# Patient Record
Sex: Female | Born: 1935 | Race: White | Hispanic: No | Smoking: Never smoker
Health system: Southern US, Community
[De-identification: ages and names within clinical notes are randomized; demographics above are authoritative.]

## PROBLEM LIST (undated history)

## (undated) DIAGNOSIS — E785 Hyperlipidemia, unspecified: Secondary | ICD-10-CM

## (undated) DIAGNOSIS — M549 Dorsalgia, unspecified: Secondary | ICD-10-CM

## (undated) DIAGNOSIS — I8390 Asymptomatic varicose veins of unspecified lower extremity: Secondary | ICD-10-CM

## (undated) HISTORY — DX: Asymptomatic varicose veins of unspecified lower extremity: I83.90

## (undated) HISTORY — DX: Dorsalgia, unspecified: M54.9

## (undated) HISTORY — DX: Hyperlipidemia, unspecified: E78.5

---

## 2011-01-20 DIAGNOSIS — L309 Dermatitis, unspecified: Secondary | ICD-10-CM | POA: Insufficient documentation

## 2011-01-20 DIAGNOSIS — Z8673 Personal history of transient ischemic attack (TIA), and cerebral infarction without residual deficits: Secondary | ICD-10-CM | POA: Insufficient documentation

## 2011-01-20 DIAGNOSIS — I8390 Asymptomatic varicose veins of unspecified lower extremity: Secondary | ICD-10-CM | POA: Insufficient documentation

## 2011-11-25 DIAGNOSIS — L719 Rosacea, unspecified: Secondary | ICD-10-CM | POA: Insufficient documentation

## 2013-05-17 DIAGNOSIS — Z90710 Acquired absence of both cervix and uterus: Secondary | ICD-10-CM | POA: Insufficient documentation

## 2013-05-17 DIAGNOSIS — N816 Rectocele: Secondary | ICD-10-CM | POA: Insufficient documentation

## 2015-12-03 ENCOUNTER — Ambulatory Visit (INDEPENDENT_AMBULATORY_CARE_PROVIDER_SITE_OTHER): Payer: Medicare HMO | Admitting: Podiatry

## 2015-12-03 ENCOUNTER — Encounter: Payer: Self-pay | Admitting: Podiatry

## 2015-12-03 ENCOUNTER — Ambulatory Visit (INDEPENDENT_AMBULATORY_CARE_PROVIDER_SITE_OTHER): Payer: Medicare HMO

## 2015-12-03 VITALS — BP 129/67 | HR 71 | Resp 16 | Ht 64.0 in | Wt 175.0 lb

## 2015-12-03 DIAGNOSIS — M79676 Pain in unspecified toe(s): Secondary | ICD-10-CM | POA: Diagnosis not present

## 2015-12-03 DIAGNOSIS — B351 Tinea unguium: Secondary | ICD-10-CM | POA: Diagnosis not present

## 2015-12-03 DIAGNOSIS — M2010 Hallux valgus (acquired), unspecified foot: Secondary | ICD-10-CM

## 2015-12-03 DIAGNOSIS — M2040 Other hammer toe(s) (acquired), unspecified foot: Secondary | ICD-10-CM

## 2015-12-03 DIAGNOSIS — Q828 Other specified congenital malformations of skin: Secondary | ICD-10-CM

## 2015-12-03 NOTE — Progress Notes (Signed)
   Subjective:    Patient ID: Beth Cantu, female    DOB: 03/27/1925, 82 y.o.   MRN: [id number]  HPI: She presents today with chief complaint of painful hammertoes and bunion deformities bilateral. She is also complaining of thick dystrophic painful nails as well as corns and calluses particularly on the tips of the toes. She states that this is one of several months years just seems to be getting worse.    Review of Systems  Musculoskeletal: Positive for arthralgias.  All other systems reviewed and are negative.      Objective:   Physical Exam: Vital signs are stable she is alert and oriented 3 pulses are palpable. Neurologic sensorium is intact. Deep tendon reflexes are intact. Muscle strength is 5 over 5 dorsiflexion plantar flexors and inverters everters on his musculature is intact. Orthopedic evaluation demonstrates all joints distal to the ankle in full range of motion without crepitus. HAV deformity rigid hammertoe deformity noted resulting in distal clavi multiple porokeratotic lesions plantar aspect of bilateral foot. Hallux abductovalgus deformity is present with thick yellow dystrophic onychomycotic nails to the hallux nails and the lesser nails alike.        Assessment & Plan:  Hallux abductovalgus deformity hammertoe deformities. Porokeratosis bilateral. Pain in limb secondary to onychomycosis.  Plan: Debridement toenails 1 through 5 bilateral. Debridement of porokeratotic lesions bilateral. Follow up with her in 3 months.

## 2015-12-20 ENCOUNTER — Ambulatory Visit: Payer: Self-pay | Admitting: Family Medicine

## 2015-12-20 ENCOUNTER — Ambulatory Visit (INDEPENDENT_AMBULATORY_CARE_PROVIDER_SITE_OTHER): Payer: Medicare HMO | Admitting: Family Medicine

## 2015-12-20 ENCOUNTER — Encounter: Payer: Self-pay | Admitting: Family Medicine

## 2015-12-20 VITALS — BP 142/80 | HR 88 | Resp 12 | Ht 64.0 in | Wt 183.5 lb

## 2015-12-20 DIAGNOSIS — R351 Nocturia: Secondary | ICD-10-CM

## 2015-12-20 DIAGNOSIS — M25579 Pain in unspecified ankle and joints of unspecified foot: Secondary | ICD-10-CM

## 2015-12-20 DIAGNOSIS — I83893 Varicose veins of bilateral lower extremities with other complications: Secondary | ICD-10-CM

## 2015-12-20 DIAGNOSIS — G459 Transient cerebral ischemic attack, unspecified: Secondary | ICD-10-CM | POA: Diagnosis not present

## 2015-12-20 DIAGNOSIS — I83892 Varicose veins of left lower extremities with other complications: Secondary | ICD-10-CM | POA: Insufficient documentation

## 2015-12-20 MED ORDER — CLOPIDOGREL BISULFATE 75 MG PO TABS: 75.0000 mg | ORAL_TABLET | Freq: Every day | ORAL | Status: DC

## 2015-12-20 NOTE — Progress Notes (Signed)
HPI:   [name] is a 82 y.o. female, who is here today to establish care with me.  Former PCP: Dr Beth Cantu in  [place], [place]    Last preventive routine visit: 01/2015.    She lives alone, daughter calls daily. . Independent ADL's and IADL's, except for occasional urinary urgency incontinence.  + falls in the past year, a few months ago, tripped. She  denies depression symptoms.  Concerns today: LE edema, L>R, chronic , Hx of vein disease, cannot put on compression stocking. She states that years ago she was evaluated by vascular surgeon, imaging done and "every thing was fine".  Edema is worse at the end of the day, no leg pain or erythema. No orthopnea or PND.      -Requesting refills for Plavix. She takes Plavix since 2003 because episode of confusion for a few minutes, attributed it to stress. She denies side effects. Denies severe/frequent headache, visual changes, chest pain, dyspnea, palpitation, claudication, or focal weakness.   -Also requesting handicap sticker, she has Hx of foot pain, which she attributes to hummer toe and bunions. Hx of  OA affecting IP toe joints and occasionally knees ("sore sometimes"). She has seen podiatist.    In general she eats healthy.  + Nocturia, 2-3 times per night, this has been going on for a year or so. No gross hematuria, dysuria, or decreased urine output. She drinks about 3 cups of coffee daily.  Denies abdominal pain, nausea, vomiting, changes in bowel habits, blood in stool or melena.     Review of Systems  Constitutional: Negative for fever, activity change, appetite change, fatigue and unexpected weight change.  HENT: Negative for mouth sores, nosebleeds and trouble swallowing.   Eyes: Negative for redness and visual disturbance.  Respiratory: Negative for cough, shortness of breath and wheezing.   Cardiovascular: Positive for leg swelling. Negative for chest pain and palpitations.  Gastrointestinal:  Negative for nausea, vomiting and abdominal pain.       Negative for changes in bowel habits.  Genitourinary: Positive for urgency. Negative for dysuria, hematuria, decreased urine volume and difficulty urinating.  Musculoskeletal: Positive for arthralgias. Negative for myalgias.  Skin: Negative for color change and rash.  Neurological: Negative for seizures, syncope, weakness, numbness and headaches.  Hematological: Does not bruise/bleed easily.  Psychiatric/Behavioral: Positive for sleep disturbance (mainly because getting up to urinate). Negative for confusion. The patient is not nervous/anxious.       Current Outpatient Prescriptions on File Prior to Visit  Medication Sig Dispense Refill  . Ergocalciferol (VITAMIN D2) 400 units TABS Take by mouth.    . Multiple Vitamins-Minerals (MULTIVITAMIN ADULTS PO) Take by mouth.    . Multiple Vitamins-Minerals (PRESERVISION AREDS 2 PO) Take by mouth.     No current facility-administered medications on file prior to visit.     No past medical history on file. Allergies  Allergen Reactions  . Aspirin     Other reaction(s): Other (See Comments) Other Reaction: GI UPSET  . Erythromycin     Other reaction(s): Other (See Comments) Makes her sick    No family history on file.  Social History   Social History  . Marital Status: Unknown    Spouse Name: N/A  . Number of Children: N/A  . Years of Education: N/A   Social History Main Topics  . Smoking status: Never Smoker   . Smokeless tobacco: None  . Alcohol Use: No  . Drug Use: None  .  Sexual Activity: Not Asked   Other Topics Concern  . None   Social History Narrative    Filed Vitals:   12/20/15 1027  BP: 142/80  Pulse: 88  Resp: 12    Body mass index is 31.48 kg/(m^2).  SpO2 Readings from Last 3 Encounters:  12/20/15 97%       Physical Exam  Nursing note and vitals reviewed. Constitutional: She is oriented to person, place, and time. She appears  well-developed. No distress.  HENT:  Head: Atraumatic.  Mouth/Throat: Oropharynx is clear and moist and mucous membranes are normal.  Eyes: Conjunctivae and EOM are normal. Pupils are equal, round, and reactive to light.  Neck: No JVD present.  Cardiovascular: Normal rate and regular rhythm.   Pulses:      Dorsalis pedis pulses are 2+ on the right side, and 2+ on the left side.  ? Soft SEM RUSB Varicose veins bilateral, L>R  Respiratory: Effort normal and breath sounds normal. No respiratory distress.  GI: Soft. She exhibits no mass. There is no tenderness.  Musculoskeletal: She exhibits edema. She exhibits no tenderness.  Lymphadenopathy:    She has no cervical adenopathy.  Neurological: She is alert and oriented to person, place, and time. She has normal strength. Coordination normal.  Slow gait, mildly unstable, no assistance  Skin: Skin is warm. No erythema.  Psychiatric: She has a normal mood and affect.  Well groomed, good eye contact.      ASSESSMENT AND PLAN:     Beth Cantu was seen today for new patient (initial visit).  Diagnoses and all orders for this visit:  Varicose veins of both legs with edema  We discussed natural hx of disease, she would like vascular evaluation to discuss treatment options, laser mainly. Referral placed. LE elevation, skin care, and dorso-flexion ROM exercises of ankle may help.   -     Ambulatory referral to Vascular Surgery  Transient cerebral ischemia, unspecified transient cerebral ischemia type  Some side effects of Plavix discussed, she would like to continue it.  -     clopidogrel (PLAVIX) 75 MG tablet; Take 1 tablet (75 mg total) by mouth daily.  Nocturia  For now recommended decreasing caffeine intake and fluids 3 hours before bedtime. Since it seems to be chronic and no dysuria or other associated symptoms, I am holding on U/A or further work-up for now.   Arthralgia of foot, unspecified laterality  Handicap sticker  given. Fall precautions discussed. I recommend considering a cane.       I will see her back for her routine physical in 3 months. We will try to obtain records from former PCP before next OV.     Beth Oleksy G. SwazilandJordan, MD  [facility]. [place] office.

## 2015-12-20 NOTE — Progress Notes (Signed)
Pre visit review using our clinic review tool, if applicable. No additional management support is needed unless otherwise documented below in the visit note. 

## 2015-12-20 NOTE — Patient Instructions (Signed)
A few things to remember from today's visit:   Varicose veins of both legs with edema - Plan: Ambulatory referral to Vascular Surgery  Transient cerebral ischemia, unspecified transient cerebral ischemia type - Plan: clopidogrel (PLAVIX) 75 MG tablet  A few tips:  -As we age balance is not as good as it was, so there is a higher risks for falls. Please remove small rugs and furniture that is "in your way" and could increase the risk of falls. Stretching exercises may help with fall prevention: Yoga and Tai Chi are some examples. Low impact exercise is better, so you are not very achy the next day.  -Sun screen and avoidance of direct sun light recommended. Caution with dehydration, if working outdoors be sure to drink enough fluids.  - Some medications are not safe as we age, increases the risk of side effects and can potentially interact with other medication you are also taken;  including some of over the counter medications. Be sure to let me know when you start a new medication even if it is a dietary/vitamin supplement.   -Healthy diet low in red meet/animal fat and sugar + regular physical activity is recommended.       Vein disease is a condition that can affect the veins in the legs. It can cause leg pain, varicose veins, swollen legs, or open sores. Varicose veins are swollen and twisted veins. Things that may help: leg exercises (ankle flexion, walking),compression stocking, OTC horse chestnut seed extract 300 mg twice daily, for itchy skin cortisone and moisturizers.  Compression stockings- [facility] in [place]  Please be sure medication list is accurate. If a new problem present, please set up appointment sooner than planned today.

## 2016-01-27 ENCOUNTER — Encounter: Payer: Self-pay | Admitting: Vascular Surgery

## 2016-01-28 ENCOUNTER — Encounter: Payer: Self-pay | Admitting: Vascular Surgery

## 2016-01-28 ENCOUNTER — Ambulatory Visit (INDEPENDENT_AMBULATORY_CARE_PROVIDER_SITE_OTHER): Payer: Medicare HMO | Admitting: Vascular Surgery

## 2016-01-28 VITALS — BP 148/81 | HR 89 | Temp 97.3°F | Resp 16 | Ht 64.0 in | Wt 184.0 lb

## 2016-01-28 DIAGNOSIS — I83892 Varicose veins of left lower extremities with other complications: Secondary | ICD-10-CM

## 2016-01-28 NOTE — Progress Notes (Signed)
Subjective:     Patient ID: Beth Cantu, female   DOB: 04-08-1925, 82 y.o.   MRN: [id number]  HPI This 82 year old female was referred by Dr. Betty SwazilandJordan for evaluation of painful varicosities and swelling in the left leg. Patient has had swelling in the left leg for several years but this has worsened. She is now developing aching throbbing and burning discomfort and bulges in her distal thigh and calf area as well as worsening of the edema. It is difficult for her to wear elastic compression stockings but she has tried them without success. She has no history of DVT thrombophlebitis stasis ulcers or bleeding. She has much less swelling in the right leg but does have a few varicosities on the right side.  Past Medical History:  Diagnosis Date  . Varicose veins     Social History  Substance Use Topics  . Smoking status: Never Smoker  . Smokeless tobacco: Never Used  . Alcohol use No    No family history on file.  Allergies  Allergen Reactions  . Aspirin     Other reaction(s): Other (See Comments) Other Reaction: GI UPSET  . Erythromycin     Other reaction(s): Other (See Comments) Makes her sick     Current Outpatient Prescriptions:  .  clopidogrel (PLAVIX) 75 MG tablet, Take 1 tablet (75 mg total) by mouth daily., Disp: 90 tablet, Rfl: 1 .  Ergocalciferol (VITAMIN D2) 400 units TABS, Take by mouth., Disp: , Rfl:  .  Multiple Vitamins-Minerals (MULTIVITAMIN ADULTS PO), Take by mouth., Disp: , Rfl:  .  Multiple Vitamins-Minerals (PRESERVISION AREDS 2 PO), Take by mouth., Disp: , Rfl:   Vitals:   01/28/16 1058  BP: (!) 148/81  Pulse: 89  Resp: 16  Temp: 97.3 F (36.3 C)  SpO2: 95%  Weight: 184 lb (83.5 kg)  Height: 5\' 4"  (1.626 m)    Body mass index is 31.58 kg/m.         Review of Systems    denies chest pain, dyspnea on exertion, PND, orthopnea, hemoptysis, claudication, lateralizing weakness. No history of coronary artery disease, diabetes mellitus, or CVA.  All systems negative and complete review of systems Objective:   Physical Exam BP (!) 148/81 (BP Location: Left Arm, Patient Position: Sitting, Cuff Size: Large)   Pulse 89   Temp 97.3 F (36.3 C)   Resp 16   Ht 5\' 4"  (1.626 m)   Wt 184 lb (83.5 kg)   SpO2 95%   BMI 31.58 kg/m     Gen.-alert and oriented x3 in no apparent distress HEENT normal for age Lungs no rhonchi or wheezing Cardiovascular regular rhythm no murmurs carotid pulses 3+ palpable no bruits audible Abdomen soft nontender no palpable masses Musculoskeletal free of  major deformities Skin clear -no rashes Neurologic normal Lower extremities 3+ femoral and dorsalis pedis pulses palpable bilaterally with no edema on the right 2+ edema on the left from mid calf distally Bulging varicosities medial distal thigh and medial calf. Early hyperpigmentation lower third left leg but no active ulcer noted.  Today I performed a bedside ultrasound sono site exam. This revealed an enlarged left great saphenous vein with gross reflux down to the midcalf level.       Assessment:     Painful varicosities left leg with chronic worsening edema and pain aggravated by gross reflux left great saphenous vein    Plan:         #1 long leg elastic compression stockings  20-30 mm gradient #2 elevate legs as much as possible #3 ibuprofen daily on a regular basis for pain #4 return in 3 months-formal venous reflux exam of left leg will be performed on return Depending on results recommendations will then be made Most likely patient will benefit from laser ablation left great saphenous vein because of pain and swelling. Return in 3 months

## 2016-02-03 ENCOUNTER — Other Ambulatory Visit: Payer: Self-pay | Admitting: Vascular Surgery

## 2016-02-03 DIAGNOSIS — I83892 Varicose veins of left lower extremities with other complications: Secondary | ICD-10-CM

## 2016-02-03 DIAGNOSIS — G459 Transient cerebral ischemic attack, unspecified: Secondary | ICD-10-CM

## 2016-02-06 ENCOUNTER — Encounter (HOSPITAL_COMMUNITY): Payer: Medicare HMO

## 2016-03-17 ENCOUNTER — Ambulatory Visit: Payer: Medicare HMO | Admitting: Podiatry

## 2016-03-19 ENCOUNTER — Encounter: Payer: Medicare HMO | Admitting: Family Medicine

## 2016-03-31 ENCOUNTER — Encounter: Payer: Self-pay | Admitting: Podiatry

## 2016-03-31 ENCOUNTER — Ambulatory Visit (INDEPENDENT_AMBULATORY_CARE_PROVIDER_SITE_OTHER): Payer: Medicare HMO | Admitting: Podiatry

## 2016-03-31 DIAGNOSIS — M79676 Pain in unspecified toe(s): Secondary | ICD-10-CM | POA: Diagnosis not present

## 2016-03-31 DIAGNOSIS — Q828 Other specified congenital malformations of skin: Secondary | ICD-10-CM

## 2016-03-31 DIAGNOSIS — B351 Tinea unguium: Secondary | ICD-10-CM

## 2016-03-31 NOTE — Progress Notes (Signed)
She presents today with chief complaint of painful elongated toenails bilaterally and painful calluses.  Objective: Vital signs are stable she is alert and oriented 3. Pulses are palpable. Neurologic sensorium is intact. Toenails are thick yellow dystrophic mycotic hammertoe deformities noted bilateral. Multiple distal clavi right foot and plantar were keratomas right foot. Edema to the left foot.  Assessment: Pain in limb secondary to onychomycosis and porokeratosis bilaterally.  Plan. Debridement of all reactive hyperkeratosis bilateral. Debridement of toenails

## 2016-04-14 ENCOUNTER — Encounter: Payer: Self-pay | Admitting: Family Medicine

## 2016-04-14 ENCOUNTER — Ambulatory Visit (INDEPENDENT_AMBULATORY_CARE_PROVIDER_SITE_OTHER): Payer: Medicare HMO | Admitting: Family Medicine

## 2016-04-14 VITALS — BP 132/80 | HR 97 | Temp 97.5°F | Resp 12 | Ht 64.0 in | Wt 183.0 lb

## 2016-04-14 DIAGNOSIS — Z0000 Encounter for general adult medical examination without abnormal findings: Secondary | ICD-10-CM

## 2016-04-14 DIAGNOSIS — L304 Erythema intertrigo: Secondary | ICD-10-CM

## 2016-04-14 DIAGNOSIS — Z23 Encounter for immunization: Secondary | ICD-10-CM | POA: Diagnosis not present

## 2016-04-14 DIAGNOSIS — E7800 Pure hypercholesterolemia, unspecified: Secondary | ICD-10-CM | POA: Diagnosis not present

## 2016-04-14 DIAGNOSIS — G459 Transient cerebral ischemic attack, unspecified: Secondary | ICD-10-CM

## 2016-04-14 LAB — BASIC METABOLIC PANEL
BUN: 19 mg/dL (ref 6–23)
CALCIUM: 9.5 mg/dL (ref 8.4–10.5)
CO2: 27 mEq/L (ref 19–32)
Chloride: 105 mEq/L (ref 96–112)
Creatinine, Ser: 0.56 mg/dL (ref 0.40–1.20)
GFR: 108.3 mL/min (ref >60.00)
GLUCOSE: 91 mg/dL (ref 70–99)
Potassium: 4.1 mEq/L (ref 3.5–5.1)
Sodium: 141 mEq/L (ref 135–145)

## 2016-04-14 LAB — LIPID PANEL
CHOLESTEROL: 198 mg/dL (ref 0–200)
HDL: 68.8 mg/dL (ref >39.00)
LDL CALC: 115 mg/dL — AB (ref 0–99)
NonHDL: 129.24
TRIGLYCERIDES: 73 mg/dL (ref 0.0–149.0)
Total CHOL/HDL Ratio: 3
VLDL: 14.6 mg/dL (ref 0.0–40.0)

## 2016-04-14 MED ORDER — NYSTATIN 100000 UNIT/GM EX CREA: 1.0000 "application " | TOPICAL_CREAM | Freq: Two times a day (BID) | CUTANEOUS | 2 refills | Status: DC

## 2016-04-14 MED ORDER — CLOPIDOGREL BISULFATE 75 MG PO TABS: 75.0000 mg | ORAL_TABLET | Freq: Every day | ORAL | 3 refills | Status: DC

## 2016-04-14 NOTE — Progress Notes (Signed)
Pre visit review using our clinic review tool, if applicable. No additional management support is needed unless otherwise documented below in the visit note. 

## 2016-04-14 NOTE — Patient Instructions (Signed)
A few things to remember from today's visit:   Routine general medical examination at a health care facility  Need for immunization against influenza - Plan: Flu vaccine HIGH DOSE PF  Pure hypercholesterolemia - Plan: Lipid panel  Transient cerebral ischemia, unspecified type - Plan: Basic metabolic panel  Intertrigo - Plan: nystatin cream (MYCOSTATIN)   A few tips:  -As we age balance is not as good as it was, so there is a higher risks for falls. Please remove small rugs and furniture that is "in your way" and could increase the risk of falls. Stretching exercises may help with fall prevention: Yoga and Tai Chi are some examples. Low impact exercise is better, so you are not very achy the next day.  -Sun screen and avoidance of direct sun light recommended. Caution with dehydration, if working outdoors be sure to drink enough fluids.  - Some medications are not safe as we age, increases the risk of side effects and can potentially interact with other medication you are also taken;  including some of over the counter medications. Be sure to let me know when you start a new medication even if it is a dietary/vitamin supplement.   -Healthy diet low in red meet/animal fat and sugar + regular physical activity is recommended.      Please be sure medication list is accurate. If a new problem present, please set up appointment sooner than planned today.

## 2016-04-14 NOTE — Progress Notes (Signed)
HPI:   Beth Cantu is a 82 y.o. female, who is here today for her routine physical.   She does not exercise regularly but she follows a healthful diet.  She lives alone, daughter calls her daily. She has a medical alert device around her neck.  Independent ADL's and IADL's, except for occasional urinary urgency incontinence, stable for years. She denies gross hematuria or dysuria.  She is active around her house, states that she could have somebody to help her with house cleaning but she is "very peculiar" and feels like she is able to keep up with house chores.  + falls in the past year, a few months ago, tripped. No falls since her last OV. She denies depression symptoms.   She does not use assistance for walking, she does not feel she needs it.   Chronic medical problems: Vein disease,chronic LE edema, currently she is  following with vascular surgeon and planning on having left lower extremity laser treatment. Intermittent mid back pain, L>R, it has been going on for years and resolved if she does not wear bra. -She also follows with podiatrists for hammer toes and bunions. + Knee arthralgias,mild soreness.  Mammogram "a good while", she denies any breast pain, masses,or nipple discharge. Colonoscopy June 2013, according to patient a small polyp was found and 10 years follow up recommended.    FHx for gynecologic or colon cancer negative.  She has some concerns today:   Rash: She has had intermittent, total of 2 episodes, of erythematous rash under right breast in the past 1-2 years. A few days ago she started with pruritic rash, it seems to be exacerbated by not drying area well after shower and with hot weather/sweating. No associated fever,chills, tingling, or burning.   -She would like to have labs done today. She is reporting mildly elevated cholesterol in the past, she is currently on non-pharmacologic treatment. History of TIA, in 2003 she had a  single episode of confusion that lasted a few minutes. Currently she is on Plavix, which she would like to continue, denies side effects.    She takes Ca++  And Vitamin D OTC 2000 units daily.  She denies hearing loss. She wears reading glasses. Recently she renewed her driving license.      Review of Systems  Constitutional: Negative for activity change, appetite change, fatigue, fever and unexpected weight change.  HENT: Negative for dental problem, mouth sores, nosebleeds and trouble swallowing.   Eyes: Negative for pain, redness and visual disturbance.  Respiratory: Negative for cough, shortness of breath and wheezing.   Cardiovascular: Positive for leg swelling (L>R and stable). Negative for chest pain and palpitations.  Gastrointestinal: Negative for abdominal pain, nausea and vomiting.       Negative for changes in bowel habits.  Endocrine: Negative for cold intolerance, heat intolerance, polydipsia, polyphagia and polyuria.  Genitourinary: Negative for decreased urine volume, dysuria and hematuria.  Musculoskeletal: Positive for back pain. Negative for gait problem and myalgias.  Skin: Positive for rash. Negative for wound.  Neurological: Negative for syncope, weakness, numbness and headaches.  Psychiatric/Behavioral: Negative for confusion and sleep disturbance. The patient is not nervous/anxious.       Current Outpatient Prescriptions on File Prior to Visit  Medication Sig Dispense Refill  . Ergocalciferol (VITAMIN D2) 400 units TABS Take by mouth.    . Multiple Vitamins-Minerals (MULTIVITAMIN ADULTS PO) Take by mouth.    . Multiple Vitamins-Minerals (PRESERVISION AREDS 2 PO) Take  by mouth.     No current facility-administered medications on file prior to visit.      Past Medical History:  Diagnosis Date  . Varicose veins     Allergies  Allergen Reactions  . Aspirin     Other reaction(s): Other (See Comments) Other Reaction: GI UPSET  . Erythromycin      Other reaction(s): Other (See Comments) Makes her sick    Family History  Problem Relation Age of Onset  . Cancer Father     bladder    Social History   Social History  . Marital status: Widowed    Spouse name: N/A  . Number of children: N/A  . Years of education: N/A   Social History Main Topics  . Smoking status: Never Smoker  . Smokeless tobacco: Never Used  . Alcohol use No  . Drug use: No  . Sexual activity: Not Currently   Other Topics Concern  . None   Social History Narrative  . None     Vitals:   04/14/16 0956  BP: 132/80  Pulse: 97  Resp: 12  Temp: 97.5 F (36.4 C)   Body mass index is 31.41 kg/m.  O2 sat at RA 99%  Wt Readings from Last 3 Encounters:  04/14/16 183 lb (83 kg)  01/28/16 184 lb (83.5 kg)  12/20/15 183 lb 8 oz (83.2 kg)    Physical Exam  Nursing note and vitals reviewed. Constitutional: She is oriented to person, place, and time. She appears well-developed. No distress.  HENT:  Head: Atraumatic.  Mouth/Throat: Oropharynx is clear and moist and mucous membranes are normal.  Eyes: Conjunctivae and EOM are normal. Pupils are equal, round, and reactive to light.  Neck: No thyroid mass and no thyromegaly present.  Cardiovascular: Normal rate and regular rhythm.   Murmur (Soft SEM RUSB) heard. Pulses:      Dorsalis pedis pulses are 2+ on the right side, and 2+ on the left side.  Varicose veins bilateral, L>R  Respiratory: Effort normal and breath sounds normal. No respiratory distress.  GI: Soft. She exhibits no mass. There is no tenderness.  Musculoskeletal: She exhibits edema (pitting LE edema, L>R.). She exhibits no tenderness.  Get up and go test > 15 seconds. She could not get on exam table. No signs of synovitis or significant limitation ROM.  Lymphadenopathy:    She has no cervical adenopathy.  Neurological: She is alert and oriented to person, place, and time. She has normal strength. Coordination normal.  Slow gait,  mildly unstable, no assistance  Skin: Skin is warm. Rash noted. Rash is macular. There is erythema.  Skin fold under left breast, erythematous macular lesion, no tender,no induration appreciated.  Psychiatric: She has a normal mood and affect. Her speech is normal. Cognition and memory are normal.  Well groomed, good eye contact.      ASSESSMENT AND PLAN:      Nemiah was seen today for annual exam.  Diagnoses and all orders for this visit:  Routine general medical examination at a health care facility   We discussed the importance of regular physical activity and healthy diet for prevention of chronic illness and/or complications. Preventive guidelines reviewed. She is not sure about having mammogram done, she tells me that she will let me know. Depression screening negative. Fall precautions, encouraged to use cane but she does not think she needs it. Cognitive function and memory grossly intact based on evaluation today and observation.  Vaccination updated. Ca++ and  vit D supplementation discussed and recommended. Next CPE in 1 year.    Pure hypercholesterolemia  Continue low fat diet. Further recommendations would be given according to lab results. She might benefit from statin med given her history of TIA.  -     Lipid panel  Transient cerebral ischemia, unspecified type  She would like to continue Plavix, some side effects discussed. Instructed about warning signs. Follow-up in one year.  -     Basic metabolic panel -     clopidogrel (PLAVIX) 75 MG tablet; Take 1 tablet (75 mg total) by mouth daily.  Intertrigo  Keep area dry. The fact that she doesn't wear bra due to back discomfort also may increase the risk of recurrence. Monitor for signs of infection.  Follow-up as needed.  -     nystatin cream (MYCOSTATIN); Apply 1 application topically 2 (two) times daily.  Need for immunization against influenza -     Flu vaccine HIGH DOSE PF      -He is  appropriate to follow annually, she agrees and will arrange appointment before if needed.     Return in about 1 year (around 04/14/2017) for routine.       Wilburn Keir G. Swaz[place]Jordan, MD  [facility]. [place] office.

## 2016-04-22 ENCOUNTER — Encounter: Payer: Self-pay | Admitting: Vascular Surgery

## 2016-05-05 ENCOUNTER — Ambulatory Visit (HOSPITAL_COMMUNITY)
Admission: RE | Admit: 2016-05-05 | Discharge: 2016-05-05 | Disposition: A | Discharge status: Home / Self Care | Payer: Medicare HMO | Source: Ambulatory Visit | Attending: Vascular Surgery | Admitting: Vascular Surgery

## 2016-05-05 ENCOUNTER — Ambulatory Visit (INDEPENDENT_AMBULATORY_CARE_PROVIDER_SITE_OTHER): Payer: Medicare HMO | Admitting: Vascular Surgery

## 2016-05-05 ENCOUNTER — Encounter: Payer: Self-pay | Admitting: Vascular Surgery

## 2016-05-05 VITALS — BP 127/80 | HR 81 | Temp 97.0°F | Resp 14 | Ht 64.0 in | Wt 184.0 lb

## 2016-05-05 DIAGNOSIS — G459 Transient cerebral ischemic attack, unspecified: Secondary | ICD-10-CM | POA: Diagnosis not present

## 2016-05-05 DIAGNOSIS — I83892 Varicose veins of left lower extremities with other complications: Secondary | ICD-10-CM | POA: Diagnosis not present

## 2016-05-05 NOTE — Progress Notes (Signed)
Subjective:     Patient ID: Beth Cantu, female   DOB: December 04, 1924, 82 y.o.   MRN: [id number]  HPI This 82 year old female returns for further discussion regarding her pain and swelling in the left leg. She has no history of DVT. She was evaluated 3 months ago and has tried long leg elastic compression stockings 20-30 millimeter gradient but they are very difficult for her to get on and actually hurt her ankles particular on the left after having the stockings in place. She has no history of ulcers or bleeding. She has much less degree of swelling in the contralateral right side. She does not take diuretics.  Past Medical History:  Diagnosis Date  . Varicose veins     Social History  Substance Use Topics  . Smoking status: Never Smoker  . Smokeless tobacco: Never Used  . Alcohol use No    Family History  Problem Relation Age of Onset  . Cancer Father     bladder    Allergies  Allergen Reactions  . Aspirin     Other reaction(s): Other (See Comments) Other Reaction: GI UPSET  . Erythromycin     Other reaction(s): Other (See Comments) Makes her sick     Current Outpatient Prescriptions:  .  clopidogrel (PLAVIX) 75 MG tablet, Take 1 tablet (75 mg total) by mouth daily., Disp: 90 tablet, Rfl: 3 .  Ergocalciferol (VITAMIN D2) 400 units TABS, Take by mouth., Disp: , Rfl:  .  Multiple Vitamins-Minerals (MULTIVITAMIN ADULTS PO), Take by mouth., Disp: , Rfl:  .  Multiple Vitamins-Minerals (PRESERVISION AREDS 2 PO), Take by mouth., Disp: , Rfl:  .  nystatin cream (MYCOSTATIN), Apply 1 application topically 2 (two) times daily. (Patient not taking: Reported on 05/05/2016), Disp: 45 g, Rfl: 2  Vitals:   05/05/16 1514  BP: 127/80  Pulse: 81  Resp: 14  Temp: 97 F (36.1 C)  SpO2: 98%  Weight: 184 lb (83.5 kg)  Height: 5\' 4"  (1.626 m)    Body mass index is 31.58 kg/m.        Review of Systems Denies chest pain, dyspnea on exertion, PND, orthopnea, hemoptysis     Objective:   Physical Exam BP 127/80 (BP Location: Left Arm, Patient Position: Sitting, Cuff Size: Normal)   Pulse 81   Temp 97 F (36.1 C)   Resp 14   Ht 5\' 4"  (1.626 m)   Wt 184 lb (83.5 kg)   SpO2 98%   BMI 31.58 kg/m   Gen. elderly female no apparent distress alert and oriented 3 Lungs no rhonchi or wheezing Left leg diffusely swollen from mid thigh to foot. No ulcerations, large varicosities, or hyperpigmentation noted. 3+ dorsalis pedis pulse palpable left foot.  I ordered a venous duplex exam the left leg which I reviewed and interpreted. There is no DVT. There is slight enlargement of the left great saphenous vein but there is no reflux noted. There is valvular incompetence in the deep vein system on the left down to the popliteal level.     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

## 2016-06-30 ENCOUNTER — Ambulatory Visit: Payer: Medicare HMO | Admitting: Podiatry

## 2016-07-21 ENCOUNTER — Encounter: Payer: Self-pay | Admitting: Podiatry

## 2016-07-21 ENCOUNTER — Encounter: Payer: Medicare HMO | Admitting: Podiatry

## 2016-07-21 NOTE — Progress Notes (Signed)
This encounter was created in error - please disregard.

## 2016-08-12 ENCOUNTER — Encounter: Payer: Self-pay | Admitting: Podiatry

## 2016-08-12 ENCOUNTER — Ambulatory Visit (INDEPENDENT_AMBULATORY_CARE_PROVIDER_SITE_OTHER): Payer: Medicare HMO | Admitting: Podiatry

## 2016-08-12 DIAGNOSIS — Q828 Other specified congenital malformations of skin: Secondary | ICD-10-CM

## 2016-08-12 DIAGNOSIS — B351 Tinea unguium: Secondary | ICD-10-CM

## 2016-08-12 DIAGNOSIS — M79676 Pain in unspecified toe(s): Secondary | ICD-10-CM | POA: Diagnosis not present

## 2016-08-12 NOTE — Progress Notes (Signed)
Patient ID: Beth Cantu, female   DOB: [birth year], 83 y.o.   MRN: [id number]   Subjective: This patient presents today complaining of painful toenails and right and left feet walking wearing shoes. She has had previous podiatric care with her last visit 03/31/2016. She also complaining of multiple hyperkeratotic lesions on her toes as well as this isolated lesion plantar right foot. Patient states that she's had the edema evaluated by the vascular surgeon she is advised to wear compression hose  Objective: Orientated 3 Peripheral edema left greater than right with pitting DP and PT pulses 2/4 bilaterally Capillary reflex immediate bilaterally Sensation to 10 g monofilament wire intact 5//5 right and 4/5 left Vibratory sensation reactive bilaterally Ankle reflex reactive bilaterally No open skin lesions bilaterally Atrophic skin bilaterally Distal keratoses second and third right and third left Nucleated plantar keratoses third MPJ right HAV bilaterally Hammertoe second right Manual motor testing dorsi flexion, plantar flexion 5/5 bilaterally  Assessment: Peripheral edema associated with stress suspect venous disease evaluated by vascular surgeon Symptomatic mycotic toenails 6-10 Keratoses 3 Porokeratosis 1  Plan: Debrided toenails 6-10 mechanically and electrically without any bleeding Debrided keratoses and porokeratosis 4 without any bleeding  Reappoint 3 months

## 2016-12-02 ENCOUNTER — Ambulatory Visit: Payer: Medicare HMO | Admitting: Podiatry

## 2016-12-15 ENCOUNTER — Ambulatory Visit (INDEPENDENT_AMBULATORY_CARE_PROVIDER_SITE_OTHER): Payer: Medicare HMO | Admitting: Podiatry

## 2016-12-15 ENCOUNTER — Encounter: Payer: Self-pay | Admitting: Podiatry

## 2016-12-15 DIAGNOSIS — B351 Tinea unguium: Secondary | ICD-10-CM | POA: Diagnosis not present

## 2016-12-15 DIAGNOSIS — Q828 Other specified congenital malformations of skin: Secondary | ICD-10-CM

## 2016-12-15 DIAGNOSIS — M79676 Pain in unspecified toe(s): Secondary | ICD-10-CM

## 2016-12-15 NOTE — Progress Notes (Signed)
Subjective: 83 y.o. returns the office today for painful, elongated, thickened toenails which she cannot trim herself. Denies any redness or drainage around the nails. She also gets painful calluses to her feet. She was seeing Dr. Leeanne Deeduchman but due to the new location of the office she [place] [facility]. Denies any acute changes since last appointment and no new complaints today. Denies any systemic complaints such as fevers, chills, nausea, vomiting.   Objective: AAO 3, NAD DP/PT pulses palpable, CRT less than 3 seconds  Nails hypertrophic, dystrophic, elongated, brittle, discolored 10. There is tenderness overlying the nails 1-5 bilaterally. There is no surrounding erythema or drainage along the nail sites. Small hyperkeratotic tissue to the right foot submet 3. No underlying ulceration, drainage, or signs of infection.  No open lesions or other pre-ulcerative lesions are identified. No other areas of tenderness bilateral lower extremities. No overlying edema, erythema, increased warmth. No pain with calf compression, swelling, warmth, erythema.  Assessment: Patient presents with symptomatic onychomycosis; hyperkeratotic lesion   Plan: -Treatment options including alternatives, risks, complications were discussed -Nails sharply debrided 10 without complication/bleeding. -Hyperkeratotic lesion sharply debrided x 1 without complications or bleeding.  -Discussed daily foot inspection. If there are any changes, to call the office immediately.  -Follow-up in 3 months or sooner if any problems are to arise. In the meantime, encouraged to call the office with any questions, concerns, changes symptoms.  Ovid CurdMatthew Wagoner, DPM

## 2017-01-19 ENCOUNTER — Other Ambulatory Visit: Payer: Self-pay | Admitting: Family Medicine

## 2017-01-19 DIAGNOSIS — G459 Transient cerebral ischemic attack, unspecified: Secondary | ICD-10-CM

## 2017-03-23 ENCOUNTER — Encounter: Payer: Self-pay | Admitting: Podiatry

## 2017-03-23 ENCOUNTER — Ambulatory Visit (INDEPENDENT_AMBULATORY_CARE_PROVIDER_SITE_OTHER): Payer: Medicare HMO | Admitting: Podiatry

## 2017-03-23 DIAGNOSIS — M79676 Pain in unspecified toe(s): Secondary | ICD-10-CM

## 2017-03-23 DIAGNOSIS — Q828 Other specified congenital malformations of skin: Secondary | ICD-10-CM | POA: Diagnosis not present

## 2017-03-23 DIAGNOSIS — B351 Tinea unguium: Secondary | ICD-10-CM

## 2017-03-23 NOTE — Progress Notes (Signed)
Subjective: 83 y.o. returns the office today for painful, elongated, thickened toenails which they cannot trim themself. Denies any redness or drainage around the nails. She also states that she has painful calluses to her feet and she points along the right fifth toe, right third toe the tip as well as on the medial side of both of her big toes. She denies he swelling or redness or drainage but they are painful with pressure in shoes.Denies any acute changes since last appointment and no new complaints today. Denies any systemic complaints such as fevers, chills, nausea, vomiting.   PCP: [place], Betty G, MD Last Seen: November 2017  Objective: AAO 3, NAD DP/PT pulses palpable, CRT less than 3 seconds Nails hypertrophic, dystrophic, elongated, brittle, discolored 10. There is tenderness overlying the nails 1-5 bilaterally. There is no surrounding erythema or drainage along the nail sites. Hyperkeratotic lesion to the dorsal lateral aspect of the right fifth toe, distal right third toe medial first MPJs bilaterally. There is no underlying ulceration drainage or signs of infection present. Hammertoes are present No open lesions or pre-ulcerative lesions are identified. No other areas of tenderness bilateral lower extremities. No overlying edema, erythema, increased warmth. No pain with calf compression, swelling, warmth, erythema.  Assessment: Patient presents with symptomatic onychomycosis; hyperkeratotic lesions  Plan: -Treatment options including alternatives, risks, complications were discussed -Nails sharply debrided 10 without complication/bleeding. -Hyperkeratotic lesions debrided 4 without any complications or bleeding. Offloading pads.  -Discussed daily foot inspection. If there are any changes, to call the office immediately.  -Follow-up in 3 months or sooner if any problems are to arise. In the meantime, encouraged to call the office with any questions, concerns, changes  symptoms.  Ovid Curd, DPM'

## 2017-04-20 ENCOUNTER — Encounter: Payer: Medicare HMO | Admitting: Family Medicine

## 2017-05-25 ENCOUNTER — Encounter: Payer: Medicare HMO | Admitting: Family Medicine

## 2017-07-09 ENCOUNTER — Encounter: Payer: Medicare HMO | Admitting: Family Medicine

## 2017-07-16 ENCOUNTER — Other Ambulatory Visit: Payer: Self-pay | Admitting: Family Medicine

## 2017-07-16 DIAGNOSIS — G459 Transient cerebral ischemic attack, unspecified: Secondary | ICD-10-CM

## 2017-07-21 ENCOUNTER — Encounter: Payer: Medicare HMO | Admitting: Family Medicine

## 2017-08-17 ENCOUNTER — Encounter: Payer: Medicare HMO | Admitting: Family Medicine

## 2017-09-12 NOTE — Progress Notes (Signed)
HPI:   Beth Cantu is a 84 y.o. female, who is here today for her routine physical.  Last CPE: 04/2016  Regular exercise 3 or more time per week: Not consistently but she is active around her house. Following a healthful diet: Yes She lives alone. Independent ADLs and IADLs, occasionally she has urine incontinence. She is still driving. No falls in the past year and denies depression symptoms.  Chronic medical problems: varicose veins LLE (has followed with Beth Cantu urgency incontinence, back pain, TIA, and knee OA.   She follows with other providers regularly:  Dermatologist annually, does not recall name. Ophthalmologist: [facility], left eye macular degeneration,receives periodic intraocular injection. Dentist 2 times per year. Podiatrist, Beth Cantu.   Immunization History  Administered Date(s) Administered  . Influenza, High Dose Seasonal PF 04/14/2016    Colonoscopy: 11/2011. She takes calcium and vitamin D supplementation. Depression screen PHQ 2/9 09/13/2017  Decreased Interest 0  Down, Depressed, Hopeless 0  PHQ - 2 Score 0   Fall Risk  09/13/2017 04/14/2016  Falls in the past year? No Yes  Number falls in past yr: - 1  Injury with Fall? - No  Risk for fall due to : - Impaired balance/gait;History of fall(s)  Follow up - Education provided   Functional Status Survey: Is the patient deaf or have difficulty hearing?: No Does the patient have difficulty seeing, even when wearing glasses/contacts?: No Does the patient have difficulty concentrating, remembering, or making decisions?: No Does the patient have difficulty walking or climbing stairs?: Yes(Needs to hold a rail.) Does the patient have difficulty dressing or bathing?: No Does the patient have difficulty doing errands alone such as visiting a doctor's office or shopping?: No  Mini-Cog - 09/13/17 1031    Normal clock drawing test?  no    How many words correct?  3       Refused vision test.  Concerns today:  She is requesting blood lab work today: Glucose,FLP, and kidney.  HLD on nonpharmacologic treatment.  Lab Results  Component Value Date   CHOL 198 04/14/2016   HDL 68.80 04/14/2016   LDLCALC 115 (H) 04/14/2016   TRIG 73.0 04/14/2016   CHOLHDL 3 04/14/2016    She needs refills on Plavix and Nystatin.  Remote history of TIA, a single episode and last a few minutes according to patient, in 2003. She takes Plavix 75 mg daily, tolerating medication well, she has no noted increased bruising or abnormal bleeding.  Intermittent pruritic skin rash under the breast,intermittently for 2-3 years. Nystatin cream has helped, problem is worse during summer.   Right upper back pain, exacerbated by "overusing" RUE, intermittent.  No history of recent injury. No associated fever, chills, or skin rash.   Review of Systems  Constitutional: Negative for appetite change, fatigue and fever.  HENT: Negative for hearing loss, mouth sores, sore throat and trouble swallowing.   Eyes: Negative for redness and visual disturbance.  Respiratory: Negative for cough, shortness of breath and wheezing.   Cardiovascular: Negative for chest pain and palpitations.  Gastrointestinal: Negative for abdominal pain, nausea and vomiting.       No changes in bowel habits.  Endocrine: Negative for cold intolerance, heat intolerance, polydipsia, polyphagia and polyuria.  Genitourinary: Negative for decreased urine volume, dysuria and hematuria.  Musculoskeletal: Positive for arthralgias and back pain. Negative for gait problem and neck pain.  Skin: Negative for color change and rash.  Neurological: Negative for syncope, weakness  and headaches.  Hematological: Does not bruise/bleed easily.  Psychiatric/Behavioral: Negative for confusion and sleep disturbance. The patient is not nervous/anxious.   All other systems reviewed and are negative.     Current Outpatient  Medications on File Prior to Visit  Medication Sig Dispense Refill  . Ergocalciferol (VITAMIN D2) 400 units TABS Take by mouth.    . Multiple Vitamins-Minerals (MULTIVITAMIN ADULTS PO) Take by mouth.    . Multiple Vitamins-Minerals (PRESERVISION AREDS 2 PO) Take by mouth.     No current facility-administered medications on file prior to visit.      Past Medical History:  Diagnosis Date  . Varicose veins     History reviewed. No pertinent surgical history.  Allergies  Allergen Reactions  . Aspirin     Other reaction(s): Other (See Comments) Other Reaction: GI UPSET  . Erythromycin     Other reaction(s): Other (See Comments) Makes her sick    Family History  Problem Relation Age of Onset  . Cancer Father        bladder    Social History   Socioeconomic History  . Marital status: Widowed    Spouse name: Not on file  . Number of children: Not on file  . Years of education: Not on file  . Highest education level: Not on file  Occupational History  . Not on file  Social Needs  . Financial resource strain: Not on file  . Food insecurity:    Worry: Not on file    Inability: Not on file  . Transportation needs:    Medical: Not on file    Non-medical: Not on file  Tobacco Use  . Smoking status: Never Smoker  . Smokeless tobacco: Never Used  Substance and Sexual Activity  . Alcohol use: No    Alcohol/week: 0.0 oz  . Drug use: No  . Sexual activity: Not Currently  Lifestyle  . Physical activity:    Days per week: Not on file    Minutes per session: Not on file  . Stress: Not on file  Relationships  . Social connections:    Talks on phone: Not on file    Gets together: Not on file    Attends religious service: Not on file    Active member of club or organization: Not on file    Attends meetings of clubs or organizations: Not on file    Relationship status: Not on file  Other Topics Concern  . Not on file  Social History Narrative  . Not on file      Vitals:   09/13/17 0958  BP: 130/70  Pulse: 89  Resp: 12  Temp: 98.1 F (36.7 C)  SpO2: 97%   Body mass index is 30.55 kg/m.   Wt Readings from Last 3 Encounters:  09/13/17 178 lb (80.7 kg)  05/05/16 184 lb (83.5 kg)  04/14/16 183 lb (83 kg)    Physical Exam  Nursing note and vitals reviewed. Constitutional: She is oriented to person, place, and time. She appears well-developed. No distress.  HENT:  Head: Normocephalic and atraumatic.  Right Ear: Hearing, tympanic membrane, external ear and ear canal normal.  Left Ear: Hearing, tympanic membrane, external ear and ear canal normal.  Mouth/Throat: Uvula is midline, oropharynx is clear and moist and mucous membranes are normal.  Eyes: Pupils are equal, round, and reactive to light. Conjunctivae are normal.  Neck: No tracheal deviation present. No thyroid mass present.  Cardiovascular: Normal rate and regular rhythm.  Murmur (soft SEM RUSB) heard. Pulses:      Dorsalis pedis pulses are 2+ on the right side, and 2+ on the left side.  Varicose veins in LE ,bilateral.  Respiratory: Effort normal and breath sounds normal. No respiratory distress.  GI: Soft. She exhibits no mass. There is no hepatomegaly. There is no tenderness.  Able to get on exam table  Musculoskeletal: She exhibits edema (Trace pitting LE edema, L>R. LLE with lymphedema. ).  Lymphadenopathy:    She has no cervical adenopathy.  Neurological: She is alert and oriented to person, place, and time. She has normal strength. No cranial nerve deficit. Coordination normal.  Reflex Scores:      Bicep reflexes are 2+ on the right side and 2+ on the left side.      Patellar reflexes are 2+ on the right side and 2+ on the left side. Mildly unstable gait with no assistance.  Skin: Skin is warm. No rash noted. No erythema.  Psychiatric: She has a normal mood and affect.  Well groomed, good eye contact.    ASSESSMENT AND PLAN:  Beth Cantu was here today  annual physical examination.   Orders Placed This Encounter  Procedures  . Lipid panel  . Basic metabolic panel    Lab Results  Component Value Date   CREATININE 0.58 09/13/2017   BUN 26 (H) 09/13/2017   NA 140 09/13/2017   K 4.3 09/13/2017   CL 102 09/13/2017   CO2 30 09/13/2017   Lab Results  Component Value Date   CHOL 174 09/13/2017   HDL 65.00 09/13/2017   LDLCALC 100 (H) 09/13/2017   TRIG 46.0 09/13/2017   CHOLHDL 3 09/13/2017    Routine general medical examination at a health care facility  Preventive guidelines reviewed. Vaccination up to date.  Ca++ and vit D supplementation to continue. Next CPE in a year.  Medicare annual wellness visit, subsequent  We discussed the importance of staying active, physically and mentally, as well as the benefits of a healthy/balance diet. Low impact exercise that involve stretching and strengthing are ideal.  We discussed preventive screening for the next 5-10 years, summery of recommendations given in AVS:  Annual influenza vaccine. Periodic eye exam and glaucoma screening Fall prevention. Clock test was abnormal, rest of cognitive exam was in normal limits.She does not think she has a problem with memory. Some cognitive tips given.  Advance directives and end of life discussed, she has a POA and living will.    Pure hypercholesterolemia  Continue nonpharmacologic treatment. Further recommendations will be given according to lab results.  -     Lipid panel -     Basic metabolic panel  H/O transient cerebral ischemia  One single episode in 2003, she tells me that she does not think this was a TIA. We discussed side effects of Plavix, explained that the risk of bleeding is greater at her age. She voices understanding, she would like to continue Plavix daily.  -     Lipid panel -     Basic metabolic panel  Intertrigo  Nystatin as needed helps with intermittent episodes. No changes in current  management. Follow-up as needed.  -     nystatin cream (MYCOSTATIN); Apply 1 application topically 2 (two) times daily.     Return in 1 year (on 09/14/2018).          Davionna Blacksher G. [place]Jordan, MD  [facility]. [place] office.

## 2017-09-13 ENCOUNTER — Encounter: Payer: Self-pay | Admitting: *Deleted

## 2017-09-13 ENCOUNTER — Encounter: Payer: Self-pay | Admitting: Family Medicine

## 2017-09-13 ENCOUNTER — Ambulatory Visit (INDEPENDENT_AMBULATORY_CARE_PROVIDER_SITE_OTHER): Payer: Medicare HMO | Admitting: Family Medicine

## 2017-09-13 VITALS — BP 130/70 | HR 89 | Temp 98.1°F | Resp 12 | Ht 64.0 in | Wt 178.0 lb

## 2017-09-13 DIAGNOSIS — G459 Transient cerebral ischemic attack, unspecified: Secondary | ICD-10-CM | POA: Diagnosis not present

## 2017-09-13 DIAGNOSIS — E7800 Pure hypercholesterolemia, unspecified: Secondary | ICD-10-CM | POA: Diagnosis not present

## 2017-09-13 DIAGNOSIS — Z8673 Personal history of transient ischemic attack (TIA), and cerebral infarction without residual deficits: Secondary | ICD-10-CM | POA: Diagnosis not present

## 2017-09-13 DIAGNOSIS — Z0000 Encounter for general adult medical examination without abnormal findings: Secondary | ICD-10-CM

## 2017-09-13 DIAGNOSIS — L304 Erythema intertrigo: Secondary | ICD-10-CM | POA: Diagnosis not present

## 2017-09-13 LAB — BASIC METABOLIC PANEL
BUN: 26 mg/dL — ABNORMAL HIGH (ref 6–23)
CHLORIDE: 102 meq/L (ref 96–112)
CO2: 30 meq/L (ref 19–32)
Calcium: 9.5 mg/dL (ref 8.4–10.5)
Creatinine, Ser: 0.58 mg/dL (ref 0.40–1.20)
GFR: 103.67 mL/min (ref >60.00)
GLUCOSE: 82 mg/dL (ref 70–99)
Potassium: 4.3 mEq/L (ref 3.5–5.1)
SODIUM: 140 meq/L (ref 135–145)

## 2017-09-13 LAB — LIPID PANEL
CHOL/HDL RATIO: 3
Cholesterol: 174 mg/dL (ref 0–200)
HDL: 65 mg/dL (ref >39.00)
LDL CALC: 100 mg/dL — AB (ref 0–99)
NonHDL: 109.38
TRIGLYCERIDES: 46 mg/dL (ref 0.0–149.0)
VLDL: 9.2 mg/dL (ref 0.0–40.0)

## 2017-09-13 MED ORDER — CLOPIDOGREL BISULFATE 75 MG PO TABS: 75.0000 mg | ORAL_TABLET | Freq: Every day | ORAL | 2 refills | Status: DC

## 2017-09-13 MED ORDER — NYSTATIN 100000 UNIT/GM EX CREA: 1.0000 "application " | TOPICAL_CREAM | Freq: Two times a day (BID) | CUTANEOUS | 4 refills | Status: DC

## 2017-09-13 NOTE — Patient Instructions (Addendum)
A few things to remember from today's visit:   Pure hypercholesterolemia  H/O transient cerebral ischemia   A few tips:  -As we age balance is not as good as it was, so there is a higher risks for falls. Please remove small rugs and furniture that is "in your way" and could increase the risk of falls. Stretching exercises may help with fall prevention: Yoga and Tai Chi are some examples. Low impact exercise is better, so you are not very achy the next day.  -Sun screen and avoidance of direct sun light recommended. Caution with dehydration, if working outdoors be sure to drink enough fluids.  - Some medications are not safe as we age, increases the risk of side effects and can potentially interact with other medication you are also taken;  including some of over the counter medications. Be sure to let me know when you start a new medication even if it is a dietary/vitamin supplement.   -Healthy diet low in red meet/animal fat and sugar + regular physical activity is recommended.     Cognitive Tips  Keep a journal/notebook with sections for the following (or use sections separately as needed):  Calendar and appointment sheet, schedule for each day, lists of reminders (such as grocery lists or "to do" list), homework assignments for therapy, important information such as family and friends names / addresses / phone numbers, medications, medical history and doctors name / phone numbers.  Avoid / remove clutter and unnecessary items from areas such as countertops / cabinets in kitchen and bathroom, closets, etc.  Organize items by purpose.  Baskets and bins help with this.  Leave notes for reminders above task to be completed.  For example: Note to turn off stove over the the stove; note to lock door beside the door, not to brush teeth then wash face by sink, note to take medication on table etc.  To help recall names of people of people or things, mentally or verbally go through the  alphabet to try to determine the 1st letter of the word as this may trigger the name or word you are looking for.  If this is too difficult and someone else knows the word, have them give you the first letter by asking, "does it start with an "A", "B", "C" etc. (or have them give you the first sound of the word or some other clue).  Review family events, occasions, names, etc.  Pictures are a good way to trigger memory.  Have others correct you if you answer something incorrectly.  Have them speak slowly with a few words to give you time to process and respond.  Don't let others automatically problem-solve. (For example: don't let them automatically lay out clothes in the correct position, but hand it to you folded so that you can figure it out for yourself.)  However, if you need help with tasks, they should give you as little as they can so that you can be successful.  If appropriate and safe, they may allow you to make mistakes so that you can figure out how to correct the error.  (For example, they may allow you to put your shoes on the wrong foot to see if you notice that is wrong).  If you struggle, they should give you a cue.  (Example: "Do your shoes feel right?"  "Do they look right?")  Screening schedule for the next 5-10 years:  Flu vaccine annually. Continue following with eye doctor.  Fall prevention  Advance directives:  Please see a lawyer and/or go to this website to help you with advanced directives and designating a health care power of attorney so that your wishes will be followed should you become too ill to make your own medical decisions.  [email or web address]       Please be sure medication list is accurate. If a new problem present, please set up appointment sooner than planned today.

## 2017-09-14 ENCOUNTER — Encounter: Payer: Self-pay | Admitting: *Deleted

## 2017-10-06 ENCOUNTER — Emergency Department (HOSPITAL_BASED_OUTPATIENT_CLINIC_OR_DEPARTMENT_OTHER): Payer: Medicare HMO

## 2017-10-06 ENCOUNTER — Emergency Department (HOSPITAL_BASED_OUTPATIENT_CLINIC_OR_DEPARTMENT_OTHER)
Admission: EM | Admit: 2017-10-06 | Discharge: 2017-10-06 | Disposition: A | Discharge status: Home / Self Care | Payer: Medicare HMO | Attending: Emergency Medicine | Admitting: Emergency Medicine

## 2017-10-06 ENCOUNTER — Encounter (HOSPITAL_BASED_OUTPATIENT_CLINIC_OR_DEPARTMENT_OTHER): Payer: Self-pay | Admitting: *Deleted

## 2017-10-06 ENCOUNTER — Other Ambulatory Visit: Payer: Self-pay

## 2017-10-06 DIAGNOSIS — Z8673 Personal history of transient ischemic attack (TIA), and cerebral infarction without residual deficits: Secondary | ICD-10-CM | POA: Insufficient documentation

## 2017-10-06 DIAGNOSIS — R2242 Localized swelling, mass and lump, left lower limb: Secondary | ICD-10-CM | POA: Diagnosis present

## 2017-10-06 DIAGNOSIS — Z7902 Long term (current) use of antithrombotics/antiplatelets: Secondary | ICD-10-CM | POA: Diagnosis not present

## 2017-10-06 DIAGNOSIS — L03116 Cellulitis of left lower limb: Secondary | ICD-10-CM

## 2017-10-06 DIAGNOSIS — Z79899 Other long term (current) drug therapy: Secondary | ICD-10-CM | POA: Insufficient documentation

## 2017-10-06 LAB — COMPREHENSIVE METABOLIC PANEL
ALT: 13 U/L — AB (ref 14–54)
AST: 17 U/L (ref 15–41)
Albumin: 3.3 g/dL — ABNORMAL LOW (ref 3.5–5.0)
Alkaline Phosphatase: 44 U/L (ref 38–126)
Anion gap: 10 (ref 5–15)
BUN: 21 mg/dL — ABNORMAL HIGH (ref 6–20)
CHLORIDE: 106 mmol/L (ref 101–111)
CO2: 23 mmol/L (ref 22–32)
CREATININE: 0.37 mg/dL — AB (ref 0.44–1.00)
Calcium: 8.6 mg/dL — ABNORMAL LOW (ref 8.9–10.3)
GFR calc non Af Amer: >60 mL/min (ref >60)
Glucose, Bld: 90 mg/dL (ref 65–99)
POTASSIUM: 3.8 mmol/L (ref 3.5–5.1)
Sodium: 139 mmol/L (ref 135–145)
Total Bilirubin: 0.9 mg/dL (ref 0.3–1.2)
Total Protein: 5.6 g/dL — ABNORMAL LOW (ref 6.5–8.1)

## 2017-10-06 LAB — CBC WITH DIFFERENTIAL/PLATELET
Basophils Absolute: 0 10*3/uL (ref 0.0–0.1)
Basophils Relative: 0 %
Eosinophils Absolute: 0.1 10*3/uL (ref 0.0–0.7)
Eosinophils Relative: 3 %
HEMATOCRIT: 37.6 % (ref 36.0–46.0)
HEMOGLOBIN: 12.7 g/dL (ref 12.0–15.0)
LYMPHS ABS: 1.3 10*3/uL (ref 0.7–4.0)
LYMPHS PCT: 24 %
MCH: 29.9 pg (ref 26.0–34.0)
MCHC: 33.8 g/dL (ref 30.0–36.0)
MCV: 88.5 fL (ref 78.0–100.0)
MONOS PCT: 17 %
Monocytes Absolute: 0.9 10*3/uL (ref 0.1–1.0)
NEUTROS ABS: 3 10*3/uL (ref 1.7–7.7)
NEUTROS PCT: 56 %
Platelets: 213 10*3/uL (ref 150–400)
RBC: 4.25 MIL/uL (ref 3.87–5.11)
RDW: 13.6 % (ref 11.5–15.5)
WBC: 5.3 10*3/uL (ref 4.0–10.5)

## 2017-10-06 MED ORDER — CEPHALEXIN 500 MG PO CAPS: 500.0000 mg | ORAL_CAPSULE | Freq: Two times a day (BID) | ORAL | 0 refills | Status: AC

## 2017-10-06 MED ORDER — CEPHALEXIN 250 MG PO CAPS
500.0000 mg | ORAL_CAPSULE | Freq: Once | ORAL | Status: AC
  Administered 2017-10-06: 500 mg via ORAL
  Filled 2017-10-06: qty 2

## 2017-10-06 NOTE — ED Triage Notes (Addendum)
Pt reports swelling to her bilateral le "for years off and on" the last few days has not been relieved by her usual elevation and wearing ted hose. Pt denies cp, sob or any other c/o. Pt reports a dime sized sore on the top of her left foot from sliding her feet into her shoes, 'my shoes are too tight from the swelling."

## 2017-10-06 NOTE — Discharge Instructions (Addendum)
If you develop worsening redness, pain in your leg, or if you notice drainage, fever, or any other new/concerning symptoms then return to the ER or see her primary care doctor.  Otherwise follow-up with your primary care doctor in the next 2 days for a wound recheck.

## 2017-10-06 NOTE — ED Provider Notes (Signed)
[facility] EMERGENCY DEPARTMENT Provider Note   CSN: [id number] Arrival date & time: 10/06/17  1818     History   Chief Complaint Chief Complaint  Patient presents with  . Leg Swelling    HPI Beth Cantu is a 84 y.o. female.     84 year old female presents with left leg swelling since yesterday.  She states she has "leaky veins" and has had problems with leg swelling in the left side multiple times in the past for years.  However she has been using her compression stockings and elevating her leg as instructed and it is not going away like typical.  She denies chest pain, shortness of breath, fevers.  She has had some redness to her lower leg with some warmth.  However she denies significant pain.  Her legs have become swollen to the point that she try to put on her shoe and developed a small abrasion to the top of her foot from where it scraped. When asked if her leg typically has redness when it swells she is not sure.  Past Medical History:  Diagnosis Date  . Varicose veins     Patient Active Problem List   Diagnosis Date Noted  . Pure hypercholesterolemia 04/14/2016  . Varicose veins of left leg with edema 12/20/2015  . TIA (transient ischemic attack) 12/20/2015  . H/O: hysterectomy 05/17/2013  . Hernia, rectovaginal 05/17/2013  . Acne erythematosa 11/25/2011  . Dermatitis, eczematoid 01/20/2011  . H/O transient cerebral ischemia 01/20/2011  . 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 No pertinent surgical history.   OB History   None      Home Medications    Prior to Admission medications   Medication Sig Start Date End Date Taking? Authorizing Provider  clopidogrel (PLAVIX) 75 MG tablet Take 1 tablet (75 mg total) by mouth daily. 09/13/17  Yes [place], Betty G, MD  Ergocalciferol (VITAMIN D2) 400 units TABS Take by mouth.   Yes [provider]  Multiple Vitamins-Minerals (MULTIVITAMIN ADULTS PO) Take by mouth. 02/25/09  Yes [provider]  cephALEXin (KEFLEX) 500 MG capsule Take 1 capsule (500 mg total) by mouth 2 (two) times daily for 5 days. 10/06/17 10/11/17  Pricilla Loveless, MD  Multiple Vitamins-Minerals (PRESERVISION AREDS 2 PO) Take by mouth.    [provider]  nystatin cream (MYCOSTATIN) Apply 1 application topically 2 (two) times daily. 09/13/17   [place], Betty G, MD    Family History Family History  Problem Relation Age of Onset  . Cancer Father        bladder    Social History Social History   Tobacco Use  . Smoking status: Never Smoker  . Smokeless tobacco: Never Used  Substance Use Topics  . Alcohol use: No    Alcohol/week: 0.0 oz  . Drug use: No     Allergies   Aspirin and Erythromycin   Review of Systems Review of Systems  Constitutional: Negative for fever.  Respiratory: Negative for shortness of breath.   Cardiovascular: Positive for leg swelling. Negative for chest pain.  Skin: Positive for color change and wound.  All other systems reviewed and are negative.    Physical Exam Updated Vital Signs BP (!) 155/60 (BP Location: Right Arm)   Pulse 73   Temp 97.7 F (36.5 C) (Oral)   Resp 20   Ht  (1.626 m)   Wt 80.7 kg (178 lb)   SpO2 97%   BMI 30.55 kg/m  Physical Exam  Constitutional: She is oriented to person, place, and time. She appears well-developed and well-nourished.  HENT:  Head: Normocephalic and atraumatic.  Right Ear: External ear normal.  Left Ear: External ear normal.  Nose: Nose normal.  Eyes: Right eye exhibits no discharge. Left eye exhibits no discharge.  Cardiovascular: Normal rate and regular rhythm.  Pulses:      Dorsalis pedis pulses are 2+ on the right side, and 2+ on the left side.  Pulmonary/Chest: Effort normal.  Abdominal: Soft.  Musculoskeletal:       Left ankle: She exhibits normal range of motion. No tenderness.       Left lower leg: She exhibits swelling. She exhibits no tenderness.       Legs:      Left foot:  There is swelling. There is normal range of motion and no tenderness.       Feet:  Neurological: She is alert and oriented to person, place, and time.  Skin: Skin is warm and dry.  Nursing note and vitals reviewed.    ED Treatments / Results  Labs (all labs ordered are listed, but only abnormal results are displayed) Labs Reviewed  COMPREHENSIVE METABOLIC PANEL - Abnormal; Notable for the following components:      Result Value   BUN 21 (*)    Creatinine, Ser 0.37 (*)    Calcium 8.6 (*)    Total Protein 5.6 (*)    Albumin 3.3 (*)    ALT 13 (*)    All other components within normal limits  CBC WITH DIFFERENTIAL/PLATELET    EKG None  Radiology US Venous Img Lower Unilateral Left  Result Date: 10/06/2017 CLINICAL DATA:  Left lower extremity swelling. This is chronic but worse yesterday. Color changes in varicose veins. EXAM: Left LOWER EXTREMITY VENOUS DOPPLER ULTRASOUND TECHNIQUE: Gray-scale sonography with graded compression, as well as color Doppler and duplex ultrasound were performed to evaluate the lower extremity deep venous systems from the level of the common femoral vein and including the common femoral, femoral, profunda femoral, popliteal and calf veins including the posterior tibial, peroneal and gastrocnemius veins when visible. The superficial great saphenous vein was also interrogated. Spectral Doppler was utilized to evaluate flow at rest and with distal augmentation maneuvers in the common femoral, femoral and popliteal veins. COMPARISON:  None. FINDINGS: Contralateral Common Femoral Vein: Respiratory phasicity is normal and symmetric with the symptomatic side. No evidence of thrombus. Normal compressibility. Common Femoral Vein: No evidence of thrombus. Normal compressibility, respiratory phasicity and response to augmentation. Saphenofemoral Junction: No evidence of thrombus. Normal compressibility and flow on color Doppler imaging. Profunda Femoral Vein: No evidence of  thrombus. Normal compressibility and flow on color Doppler imaging. Femoral Vein: No evidence of thrombus. Normal compressibility, respiratory phasicity and response to augmentation. Popliteal Vein: No evidence of thrombus. Normal compressibility, respiratory phasicity and response to augmentation. Calf Veins: No evidence of thrombus. Normal compressibility and flow on color Doppler imaging. Superficial Great Saphenous Vein: No evidence of thrombus. Normal compressibility. Other Findings: There is a small amount of subcutaneous soft tissue edema in the ankle region. IMPRESSION: No evidence of deep venous thrombosis. Electronically Signed   By: Burman Nieves M.D.   On: 10/06/2017 22:05    Procedures Procedures (including critical care time)  Medications Ordered in ED Medications  cephALEXin (KEFLEX) capsule 500 mg (500 mg Oral Given 10/06/17 2230)     Initial Impression / Assessment and Plan / ED Course  I have reviewed the  triage vital signs and the nursing notes.  Pertinent labs & imaging results that were available during my care of the patient were reviewed by me and considered in my medical decision making (see chart for details).     Given the unilateral swelling, DVT ultrasound obtained but is negative.  Lab work is reassuring and compared to her baseline.  At this point, given there is some redness, I think covering for cellulitis is a good idea, especially with the recent abrasion to the top of her foot.  Will discharge with Keflex.  Otherwise continue using compression stockings and elevation.  Follow-up with PCP for wound check.  Discussed return precautions.  Final Clinical Impressions(s) / ED Diagnoses   Final diagnoses:  Left leg cellulitis    ED Discharge Orders        Ordered    cephALEXin (KEFLEX) 500 MG capsule  2 times daily     10/06/17 2222       Pricilla Loveless, MD 10/06/17 2330

## 2017-10-07 ENCOUNTER — Encounter: Payer: Self-pay | Admitting: Family Medicine

## 2017-10-07 LAB — HM DIABETES EYE EXAM

## 2017-10-08 ENCOUNTER — Encounter: Payer: Self-pay | Admitting: Family Medicine

## 2017-10-08 ENCOUNTER — Ambulatory Visit (INDEPENDENT_AMBULATORY_CARE_PROVIDER_SITE_OTHER): Payer: Medicare HMO | Admitting: Family Medicine

## 2017-10-08 VITALS — BP 132/80 | HR 90 | Temp 97.9°F | Resp 12 | Ht 64.0 in | Wt 178.5 lb

## 2017-10-08 DIAGNOSIS — I83892 Varicose veins of left lower extremities with other complications: Secondary | ICD-10-CM | POA: Diagnosis not present

## 2017-10-08 DIAGNOSIS — L97521 Non-pressure chronic ulcer of other part of left foot limited to breakdown of skin: Secondary | ICD-10-CM

## 2017-10-08 DIAGNOSIS — L03116 Cellulitis of left lower limb: Secondary | ICD-10-CM

## 2017-10-08 NOTE — Patient Instructions (Addendum)
A few things to remember from today's visit:   Skin ulcer of left foot, limited to breakdown of skin (HCC)  Cellulitis of left lower extremity  Continue current management, keep wound uncovered,and elevate leg a few time per day.   Please be sure medication list is accurate. If a new problem present, please set up appointment sooner than planned today.

## 2017-10-08 NOTE — Progress Notes (Signed)
ACUTE VISIT   HPI:  Chief Complaint  Patient presents with  . Leg Swelling    Left leg swelling, started late Sunday, early Monday, went to ER on Wednesday    Beth Cantu is a 84 y.o. female, who is here today with her daughter to follow on recent ER visit. She is following earlier because she wanted to be sure everything was fine before the weekend. LLE edema that started 5 days ago,worse 2 days ago.  She was in the ER on 10/06/17 because left foot edema,ulcer, and erythema. Hx of LE varicose veins, it seems like she had a small blister on left foot and when she put on her shoe she did burst it. Intermittent LE edema, usually worse at the end of the day ,depending of activity during the day and better in the morning. Usually L>R. She has followed with vascular and vein specialist.  LE [place] neg for DVT  She was discharged on Keflex for possible cellulitis around ulcer.  She has not noted fever,chills,MS changes, or changes in appetite. Erythema and food edema have improved, she is elevating LLE a few times during the day.  She wears compression stocking.   Lab Results  Component Value Date   WBC 5.3 10/06/2017   HGB 12.7 10/06/2017   HCT 37.6 10/06/2017   MCV 88.5 10/06/2017   PLT 213 10/06/2017   Lab Results  Component Value Date   CREATININE 0.37 (L) 10/06/2017   BUN 21 (H) 10/06/2017   NA 139 10/06/2017   K 3.8 10/06/2017   CL 106 10/06/2017   CO2 23 10/06/2017    Review of Systems  Constitutional: Negative for appetite change, chills, fatigue and fever.  Respiratory: Negative for shortness of breath and wheezing.   Cardiovascular: Positive for leg swelling. Negative for chest pain and palpitations.  Gastrointestinal: Negative for abdominal pain, nausea and vomiting.  Genitourinary: Negative for decreased urine volume and dysuria.  Skin: Positive for wound. Negative for pallor.  Neurological: Negative for weakness, numbness and headaches.    Psychiatric/Behavioral: Negative for confusion and sleep disturbance.      Current Outpatient Medications on File Prior to Visit  Medication Sig Dispense Refill  . cephALEXin (KEFLEX) 500 MG capsule Take 1 capsule (500 mg total) by mouth 2 (two) times daily for 5 days. 10 capsule 0  . clopidogrel (PLAVIX) 75 MG tablet Take 1 tablet (75 mg total) by mouth daily. 90 tablet 2  . Ergocalciferol (VITAMIN D2) 400 units TABS Take by mouth.    . Multiple Vitamins-Minerals (MULTIVITAMIN ADULTS PO) Take by mouth.    . Multiple Vitamins-Minerals (PRESERVISION AREDS 2 PO) Take by mouth.    . nystatin cream (MYCOSTATIN) Apply 1 application topically 2 (two) times daily. 45 g 4   No current facility-administered medications on file prior to visit.      Past Medical History:  Diagnosis Date  . Varicose veins    Allergies  Allergen Reactions  . Aspirin     Other reaction(s): Other (See Comments) Other Reaction: GI UPSET  . Erythromycin     Other reaction(s): Other (See Comments) Makes her sick    Social History   Socioeconomic History  . Marital status: Widowed    Spouse name: Not on file  . Number of children: Not on file  . Years of education: Not on file  . Highest education level: Not on file  Occupational History  . Not on file  Social Needs  .  Financial resource strain: Not on file  . Food insecurity:    Worry: Not on file    Inability: Not on file  . Transportation needs:    Medical: Not on file    Non-medical: Not on file  Tobacco Use  . Smoking status: Never Smoker  . Smokeless tobacco: Never Used  Substance and Sexual Activity  . Alcohol use: No    Alcohol/week: 0.0 oz  . Drug use: No  . Sexual activity: Not Currently  Lifestyle  . Physical activity:    Days per week: Not on file    Minutes per session: Not on file  . Stress: Not on file  Relationships  . Social connections:    Talks on phone: Not on file    Gets together: Not on file    Attends  religious service: Not on file    Active member of club or organization: Not on file    Attends meetings of clubs or organizations: Not on file    Relationship status: Not on file  Other Topics Concern  . Not on file  Social History Narrative  . Not on file    Vitals:   10/08/17 0950  BP: 132/80  Pulse: 90  Resp: 12  Temp: 97.9 F (36.6 C)  SpO2: 94%   Body mass index is 30.64 kg/m.  Physical Exam  Nursing note and vitals reviewed. Constitutional: She is oriented to person, place, and time. She appears well-developed. No distress.  HENT:  Head: Normocephalic and atraumatic.  Mouth/Throat: Oropharynx is clear and moist and mucous membranes are normal.  Eyes: Conjunctivae are normal.  Cardiovascular: Normal rate and regular rhythm.  Pulses:      Dorsalis pedis pulses are 2+ on the right side, and 2+ on the left side.  Varicose veins LE,bilateral.  Respiratory: Effort normal and breath sounds normal. No respiratory distress.  Musculoskeletal: She exhibits edema (Left pedal edema, pitting. LLE treace pitting edema.). She exhibits no tenderness.       Feet:  Neurological: She is alert and oriented to person, place, and time.  Skin: Skin is warm. There is erythema.  Left foot dorsum,medial aspect a 2 cm X 1.2 cm superficial ulcer with granulation tissue. Mild surrounding erythema.  Psychiatric: She has a normal mood and affect. Her speech is normal.  Well groomed, good eye contact.    ASSESSMENT AND PLAN:   Beth Cantu was seen today for leg swelling.  Diagnoses and all orders for this visit:  Skin ulcer of left foot, limited to breakdown of skin (HCC)  She had a bandage covering lesion, recommend to keep area uncovered during the day and to use non sticky bandage. Keep lesion clean with soap and water. F/U in 7 days.  Cellulitis of left lower extremity  Erythema on dorsum of left foot improving. Complete abx treatment. Instructed about warning signs.  Varicose  veins of left leg with edema  She can start wearing compression stocking when ulcer resolves. LE elevation a few times in the afternoon. Good skin care.    Return in about 1 week (around 10/15/2017) for wound check..    Virgie Kunda G. [place], MD  [facility]. [facility].

## 2017-10-12 ENCOUNTER — Other Ambulatory Visit: Payer: Self-pay | Admitting: Family Medicine

## 2017-10-12 DIAGNOSIS — G459 Transient cerebral ischemic attack, unspecified: Secondary | ICD-10-CM

## 2017-10-15 ENCOUNTER — Ambulatory Visit (INDEPENDENT_AMBULATORY_CARE_PROVIDER_SITE_OTHER): Payer: Medicare HMO | Admitting: Family Medicine

## 2017-10-15 ENCOUNTER — Encounter: Payer: Self-pay | Admitting: Family Medicine

## 2017-10-15 VITALS — BP 124/76 | HR 100 | Temp 98.0°F | Resp 12 | Ht 64.0 in | Wt 176.0 lb

## 2017-10-15 DIAGNOSIS — L97529 Non-pressure chronic ulcer of other part of left foot with unspecified severity: Secondary | ICD-10-CM

## 2017-10-15 DIAGNOSIS — I83892 Varicose veins of left lower extremities with other complications: Secondary | ICD-10-CM

## 2017-10-15 NOTE — Progress Notes (Signed)
HPI:   [name] is a 84 y.o. female, who is here today to follow on recent OV.   She was seen on 10/08/17 due to LLE edema and ulcer on left foot with mild cellulitis. She completed abx treatment,Cephalexine. Tolerated medication well,no side effects.  Hx of vein disease,L>R.  She is not wearing compression stocking. Edema alleviated by LE elevation.   Edema and ulcer have improved. She is not having pain. Irritates lesion when trying to put on shoe.   No fever,chills,changes in appetite or MS.   No new concerns today.   Review of Systems  Constitutional: Negative for appetite change, chills, fatigue and fever.  Respiratory: Negative for shortness of breath and wheezing.   Cardiovascular: Positive for leg swelling. Negative for chest pain and palpitations.  Gastrointestinal: Negative for abdominal pain, nausea and vomiting.  Skin: Positive for wound.  Psychiatric/Behavioral: Negative for confusion. The patient is not nervous/anxious.       Current Outpatient Medications on File Prior to Visit  Medication Sig Dispense Refill  . clopidogrel (PLAVIX) 75 MG tablet TAKE 1 TABLET BY MOUTH EVERY DAY 90 tablet 1  . Ergocalciferol (VITAMIN D2) 400 units TABS Take by mouth.    . Multiple Vitamins-Minerals (MULTIVITAMIN ADULTS PO) Take by mouth.    . Multiple Vitamins-Minerals (PRESERVISION AREDS 2 PO) Take by mouth.    . nystatin cream (MYCOSTATIN) Apply 1 application topically 2 (two) times daily. 45 g 4   No current facility-administered medications on file prior to visit.      Past Medical History:  Diagnosis Date  . Varicose veins    Allergies  Allergen Reactions  . Aspirin     Other reaction(s): Other (See Comments) Other Reaction: GI UPSET  . Erythromycin     Other reaction(s): Other (See Comments) Makes her sick    Social History   Socioeconomic History  . Marital status: Widowed    Spouse name: Not on file  . Number of children: Not on file    . Years of education: Not on file  . Highest education level: Not on file  Occupational History  . Not on file  Social Needs  . Financial resource strain: Not on file  . Food insecurity:    Worry: Not on file    Inability: Not on file  . Transportation needs:    Medical: Not on file    Non-medical: Not on file  Tobacco Use  . Smoking status: Never Smoker  . Smokeless tobacco: Never Used  Substance and Sexual Activity  . Alcohol use: No    Alcohol/week: 0.0 oz  . Drug use: No  . Sexual activity: Not Currently  Lifestyle  . Physical activity:    Days per week: Not on file    Minutes per session: Not on file  . Stress: Not on file  Relationships  . Social connections:    Talks on phone: Not on file    Gets together: Not on file    Attends religious service: Not on file    Active member of club or organization: Not on file    Attends meetings of clubs or organizations: Not on file    Relationship status: Not on file  Other Topics Concern  . Not on file  Social History Narrative  . Not on file    Vitals:   10/15/17 1043  BP: 124/76  Pulse: 100  Resp: 12  Temp: 98 F (36.7 C)  SpO2:  96%   Body mass index is 30.21 kg/m.   Physical Exam  Nursing note and vitals reviewed. Constitutional: She is oriented to person, place, and time. She appears well-developed. No distress.  HENT:  Head: Normocephalic and atraumatic.  Eyes: Conjunctivae are normal.  Cardiovascular: Normal rate and regular rhythm.  Pulses:      Dorsalis pedis pulses are 2+ on the right side, and 2+ on the left side.  Respiratory: Effort normal and breath sounds normal. No respiratory distress.  Musculoskeletal: She exhibits edema (Trace pitting edema LLE).  Lymphadenopathy:    She has no cervical adenopathy.  Neurological: She is alert and oriented to person, place, and time. She has normal strength.  Mildly unstable gait with no assistance.  Skin: Skin is warm. No rash noted. No erythema.   Dorsum of left foot with superficial ulcer (2 cm x 1 cm) healing well,cover by dry brown crust.No erythema, no necrotic tissue of fluctuant area.   Psychiatric: She has a normal mood and affect.  Well groomed, good eye contact.    ASSESSMENT AND PLAN:  Ms. Beth Cantu was seen today for follow-up.  Diagnoses and all orders for this visit:  Ulcer of left foot, unspecified ulcer stage (HCC)  Erythema and edema around lesion has resolved. Healing well. Instructed to monitor for signs of complications. Recommend keeping area clean with soap and water. Moisturize skin around lesion with vaseline ,avoid trauma with shoe. Instructed about warning signs. F/U as needed.   Varicose veins of left leg with edema  She can start wearing compression stocking. Continue appropriate skin care.     15 min face to face OV. > 50% was dedicated to discussion of Dx, prognosis,and coordination of care.     Beth G. [place], MD  [facility]. [place] [facility].

## 2017-10-15 NOTE — Patient Instructions (Addendum)
A few things to remember from today's visit:   Ulcer of left foot, unspecified ulcer stage (HCC)  Varicose veins of left leg with edema  Keep area moisturized, monitor for redness or drainage. Elevation and compression stocking.    Please be sure medication list is accurate. If a new problem present, please set up appointment sooner than planned today.

## 2018-03-21 ENCOUNTER — Telehealth: Payer: Self-pay | Admitting: Family Medicine

## 2018-03-21 NOTE — Telephone Encounter (Signed)
Copied from CRM [id number]. Topic: General - Other >> Mar 21, 2018 11:52 AM Gerrianne Scale wrote: Reason for CRM: Daughter Jamal Collin calling stating that she has given her mother a cold and want something called into the pharmacy pt symptoms chest congestion  cough  runny nose no sore throat  her pharmacy [id number] - [place], [place] - [address] AT [facility] [address] [phone] (Phone) [phone] (Fax)  Please call daughter Okey Regal back at [phone]

## 2018-03-21 NOTE — Telephone Encounter (Signed)
OTC Rhinocort nasal spray daily. OTC antihistaminic like Zyrtec or Allegra may also help. Saline nasal irrigations several times per day. If symptoms get worse or fever develops evaluation here in the office may be necessary.  Thanks, BJ

## 2018-03-21 NOTE — Telephone Encounter (Signed)
Spoke with Okey Regal and gave directions per Dr. [place].

## 2018-03-21 NOTE — Telephone Encounter (Signed)
Spoke to [place].  Sx started 03/20/18.  She c/o of nasal congestion, cough (productive/small amount) and sneezing.  Nose is so stuffy that she cannot sleep at night.  No fever, chills or body aches.  Explained that a cold is viral so there may not be anything Dr. [place] can do.  Okey Regal would like to know what she can give her mother OTC that is safe for her age. Please advise.

## 2018-03-21 NOTE — Telephone Encounter (Signed)
Message sent to Dr. Jordan for review. Please advise 

## 2018-08-29 ENCOUNTER — Other Ambulatory Visit: Payer: Self-pay | Admitting: Family Medicine

## 2018-08-29 DIAGNOSIS — G459 Transient cerebral ischemic attack, unspecified: Secondary | ICD-10-CM

## 2018-08-29 MED ORDER — CLOPIDOGREL BISULFATE 75 MG PO TABS: 75.0000 mg | ORAL_TABLET | Freq: Every day | ORAL | 1 refills | Status: DC

## 2019-02-24 ENCOUNTER — Telehealth: Payer: Self-pay | Admitting: Family Medicine

## 2019-02-24 DIAGNOSIS — G459 Transient cerebral ischemic attack, unspecified: Secondary | ICD-10-CM

## 2019-02-24 NOTE — Telephone Encounter (Signed)
Medication: clopidogrel (PLAVIX) 75 MG tablet [   Patient is requesting a refill of this medication.    Pharmacy:  [facility] [place], [place] AT [place] [phone] (Phone) [phone] (Fax)

## 2019-02-24 NOTE — Telephone Encounter (Signed)
Requested medication (s) are due for refill today: yes  Requested medication (s) are on the active medication list: yes  Last refill:  08/29/2018  Future visit scheduled: no  Notes to clinic:  Needs labs    Requested Prescriptions  Pending Prescriptions Disp Refills   clopidogrel (PLAVIX) 75 MG tablet 90 tablet 1    Sig: Take 1 tablet (75 mg total) by mouth daily.     Hematology: Antiplatelets - clopidogrel Failed - 02/24/2019  3:42 PM      Failed - Evaluate AST, ALT within 2 months of therapy initiation.      Failed - ALT in normal range and within 360 days    ALT  Date Value Ref Range Status  10/06/2017 13 (L) 14 - 54 U/L Final         Failed - AST in normal range and within 360 days    AST  Date Value Ref Range Status  10/06/2017 17 15 - 41 U/L Final         Failed - HCT in normal range and within 180 days    HCT  Date Value Ref Range Status  10/06/2017 37.6 36.0 - 46.0 % Final         Failed - HGB in normal range and within 180 days    Hemoglobin  Date Value Ref Range Status  10/06/2017 12.7 12.0 - 15.0 g/dL Final         Failed - PLT in normal range and within 180 days    Platelets  Date Value Ref Range Status  10/06/2017 213 150 - 400 K/uL Final         Failed - Valid encounter within last 6 months    Recent Outpatient Visits          1 year ago Ulcer of left foot, unspecified ulcer stage ([place])   [place] [facility] [place], Malka So, MD   1 year ago Skin ulcer of left foot, limited to breakdown of skin ([place])   [facility] [place], Malka So, MD   1 year ago Routine general medical examination at a health care facility   [facility] [place], Malka So, MD   2 years ago Routine general medical examination at a health care facility   [employer] [facility] [place], Malka So, MD   3 years ago Varicose veins of both legs with edema   [facility] [place], Malka So, MD

## 2019-02-25 ENCOUNTER — Other Ambulatory Visit: Payer: Self-pay | Admitting: Family Medicine

## 2019-02-25 DIAGNOSIS — G459 Transient cerebral ischemic attack, unspecified: Secondary | ICD-10-CM

## 2019-02-28 ENCOUNTER — Other Ambulatory Visit: Payer: Self-pay | Admitting: Family Medicine

## 2019-02-28 ENCOUNTER — Other Ambulatory Visit: Payer: Self-pay | Admitting: *Deleted

## 2019-02-28 DIAGNOSIS — G459 Transient cerebral ischemic attack, unspecified: Secondary | ICD-10-CM

## 2019-02-28 MED ORDER — CLOPIDOGREL BISULFATE 75 MG PO TABS: 75.0000 mg | ORAL_TABLET | Freq: Every day | ORAL | 0 refills | Status: DC

## 2019-02-28 NOTE — Telephone Encounter (Signed)
Rx sent to pharmacy for 30 day supply, left detailed message for patient to call office to schedule visit for further refills.

## 2019-02-28 NOTE — Telephone Encounter (Signed)
Pt called for an update on the refill request. Pt requests call back. °

## 2019-03-01 NOTE — Telephone Encounter (Addendum)
Pt has made appt for phone visit.

## 2019-03-03 ENCOUNTER — Other Ambulatory Visit: Payer: Self-pay | Admitting: Family Medicine

## 2019-03-03 DIAGNOSIS — G459 Transient cerebral ischemic attack, unspecified: Secondary | ICD-10-CM

## 2019-03-06 ENCOUNTER — Telehealth (INDEPENDENT_AMBULATORY_CARE_PROVIDER_SITE_OTHER): Payer: Medicare HMO | Admitting: Family Medicine

## 2019-03-06 ENCOUNTER — Other Ambulatory Visit: Payer: Self-pay

## 2019-03-06 DIAGNOSIS — G459 Transient cerebral ischemic attack, unspecified: Secondary | ICD-10-CM | POA: Diagnosis not present

## 2019-03-06 DIAGNOSIS — I83892 Varicose veins of left lower extremities with other complications: Secondary | ICD-10-CM

## 2019-03-06 DIAGNOSIS — L304 Erythema intertrigo: Secondary | ICD-10-CM | POA: Diagnosis not present

## 2019-03-06 MED ORDER — NYSTATIN 100000 UNIT/GM EX CREA: 1.0000 "application " | TOPICAL_CREAM | Freq: Two times a day (BID) | CUTANEOUS | 2 refills | Status: DC

## 2019-03-06 MED ORDER — CLOPIDOGREL BISULFATE 75 MG PO TABS: 75.0000 mg | ORAL_TABLET | Freq: Every day | ORAL | 3 refills | Status: DC

## 2019-03-06 NOTE — Progress Notes (Signed)
Virtual Visit via Telephone Note  I connected with Beth Cantu on 03/06/19 at  2:30 PM EDT by telephone and verified that I am speaking with the correct person using two identifiers.   I discussed the limitations, risks, security and privacy concerns of performing an evaluation and management service by telephone and the availability of in person appointments. I also discussed with the patient that there may be a patient responsible charge related to this service. The patient expressed understanding and agreed to proceed.  Location patient: home Location provider: work office Participants present for the call: patient, provider Patient did not have a visit in the prior 7 days to address this/these issue(s).   History of Present Illness: Beth Cantu is a 85 yo female following on some chronic health problems today. She was last seen on 10/15/17.  Requesting refill for Plavix and nystatin cream.  She is trying not to leave the house if possible and does not feel comfortable coming to the clinic. Her daughter buys groceries for her. She is dealing well with social isolation. She enjoys watching TV, golden Girls and the news.   Lower extremity edema is stable. Seems to be worse at the end of the day. She has a hard time reporting compression stockings on. She has not noted erythema or ulcers.  TIA: Currently she is on Plavix 75 mg daily. She has not noted side effects.  She does not check BP regularly but recently at her ophthalmologist office BP was 128/82. Denies severe/frequent headache, visual changes, chest pain, dyspnea, palpitation,focal weakness,or syncope.  Intertrigo: Erythematosus pruritic rash under breasts. Problem seems to be exacerbated by sweating and heat. Nystatin cream has helped. She denies fever, chills, tenderness, local edema, or drainage.   Observations/Objective: Patient sounds cheerful and well on the phone. I do not appreciate any SOB. Speech and thought  processing are grossly intact. Patient reported vitals:N/A  Assessment and Plan: 1. Transient cerebral ischemia, unspecified type Side effects of Plavix discussed, she would like to continue. She is not on statin medication.  - clopidogrel (PLAVIX) 75 MG tablet; Take 1 tablet (75 mg total) by mouth daily.  Dispense: 90 tablet; Refill: 3  2. Intertrigo Try to avoid trigger factors. Nystatin cream to continue bid as needed. Keep area clean and dry. Monitor for signs of infection.  - nystatin cream (MYCOSTATIN); Apply 1 application topically 2 (two) times daily.  Dispense: 60 g; Refill: 2  3. Varicose veins of left leg with edema LE elevation and compression stocking. I am mailing information about devise that may help with putting compression stockings. Appropriate skin care discussed.   Follow Up Instructions:  Return in about 1 year (around 03/05/2020) for cpe.   I did not refer this patient for an OV in the next 24 hours for this/these issue(s).  I discussed the assessment and treatment plan with the patient. She was provided an opportunity to ask questions and all were answered. She agreed with the plan and demonstrated an understanding of the instructions.   The patient was advised to call back or seek an in-person evaluation if the symptoms worsen or if the condition fails to improve as anticipated.  I provided 21 minutes of non-face-to-face time during this encounter.   Dashun Borre Martinique, MD

## 2019-06-28 ENCOUNTER — Ambulatory Visit: Payer: Medicare Other | Attending: Internal Medicine

## 2019-06-28 DIAGNOSIS — Z23 Encounter for immunization: Secondary | ICD-10-CM

## 2019-06-28 NOTE — Progress Notes (Signed)
   Covid-19 Vaccination Clinic  Name:  Beth Cantu    MRN: [id number] DOB: [birth year]  06/28/2019  [name] was observed post Covid-19 immunization for 15 minutes without incidence. She was provided with Vaccine Information Sheet and instruction to access the V-Safe system.   [name] was instructed to call 911 with any severe reactions post vaccine: Marland Kitchen Difficulty breathing  . Swelling of your face and throat  . A fast heartbeat  . A bad rash all over your body  . Dizziness and weakness    Immunizations Administered    Name Date Dose VIS Date Route   Pfizer COVID-19 Vaccine 06/28/2019  4:32 PM 0.3 mL 05/19/2019 Intramuscular   Manufacturer: [employer], [employer]   Lot: [id number]   NDC: [address]-1

## 2019-07-16 ENCOUNTER — Ambulatory Visit: Payer: Medicare HMO | Attending: Internal Medicine

## 2019-07-16 DIAGNOSIS — Z23 Encounter for immunization: Secondary | ICD-10-CM | POA: Insufficient documentation

## 2019-07-16 NOTE — Progress Notes (Signed)
   Covid-19 Vaccination Clinic  Name:  Beth Cantu    MRN: [id number] DOB: 11-11-1924  07/16/2019  Beth Cantu was observed post Covid-19 immunization for 30 minutes based on pre-vaccination screening without incidence. She was provided with Vaccine Information Sheet and instruction to access the V-Safe system.   Beth Cantu was instructed to call 911 with any severe reactions post vaccine: Marland Kitchen Difficulty breathing  . Swelling of your face and throat  . A fast heartbeat  . A bad rash all over your body  . Dizziness and weakness    Immunizations Administered    Name Date Dose VIS Date Route   Pfizer COVID-19 Vaccine 07/16/2019 10:52 AM 0.3 mL 05/19/2019 Intramuscular   Manufacturer: [employer], [employer]   Lot: [id number]   NDC: [id number]

## 2019-08-03 ENCOUNTER — Encounter: Payer: Self-pay | Admitting: Family Medicine

## 2019-10-03 ENCOUNTER — Telehealth: Payer: Self-pay | Admitting: Family Medicine

## 2019-10-03 NOTE — Telephone Encounter (Signed)
Pt states due to her disability and having a steep inclined drive way she would like for her PCP to write a letter to the post office explaining she is unable to walk down her driveway and to deliver her mail to her door.  Pt would like it mailed to her once it is ready. Address on file has been verified.   Pt would like a call once it has been mailed [phone]

## 2019-10-03 NOTE — Telephone Encounter (Signed)
Message Routed to PCP for review and approval. 

## 2019-10-04 ENCOUNTER — Encounter: Payer: Self-pay | Admitting: Family Medicine

## 2019-10-04 NOTE — Telephone Encounter (Signed)
Letter completed and mailed to address on file. Patient notified and verbalized understanding.

## 2019-10-04 NOTE — Telephone Encounter (Signed)
Yes, it is ok to provide letter as requested. Thanks, BJ

## 2019-10-30 ENCOUNTER — Other Ambulatory Visit: Payer: Self-pay

## 2019-10-31 ENCOUNTER — Ambulatory Visit (INDEPENDENT_AMBULATORY_CARE_PROVIDER_SITE_OTHER): Payer: Medicare HMO | Admitting: Family Medicine

## 2019-10-31 ENCOUNTER — Encounter: Payer: Self-pay | Admitting: Family Medicine

## 2019-10-31 VITALS — BP 132/80 | HR 89 | Temp 97.7°F | Resp 16 | Ht 64.0 in | Wt 177.2 lb

## 2019-10-31 DIAGNOSIS — Z0000 Encounter for general adult medical examination without abnormal findings: Secondary | ICD-10-CM

## 2019-10-31 DIAGNOSIS — G459 Transient cerebral ischemic attack, unspecified: Secondary | ICD-10-CM | POA: Diagnosis not present

## 2019-10-31 DIAGNOSIS — I872 Venous insufficiency (chronic) (peripheral): Secondary | ICD-10-CM

## 2019-10-31 DIAGNOSIS — E7800 Pure hypercholesterolemia, unspecified: Secondary | ICD-10-CM | POA: Diagnosis not present

## 2019-10-31 DIAGNOSIS — L304 Erythema intertrigo: Secondary | ICD-10-CM | POA: Diagnosis not present

## 2019-10-31 DIAGNOSIS — N3281 Overactive bladder: Secondary | ICD-10-CM

## 2019-10-31 DIAGNOSIS — M159 Polyosteoarthritis, unspecified: Secondary | ICD-10-CM

## 2019-10-31 LAB — COMPREHENSIVE METABOLIC PANEL
ALT: 12 U/L (ref 0–35)
AST: 16 U/L (ref 0–37)
Albumin: 4.1 g/dL (ref 3.5–5.2)
Alkaline Phosphatase: 64 U/L (ref 39–117)
BUN: 17 mg/dL (ref 6–23)
CO2: 30 mEq/L (ref 19–32)
Calcium: 9.5 mg/dL (ref 8.4–10.5)
Chloride: 103 mEq/L (ref 96–112)
Creatinine, Ser: 0.51 mg/dL (ref 0.40–1.20)
GFR: 112.61 mL/min (ref >60.00)
Glucose, Bld: 95 mg/dL (ref 70–99)
Potassium: 4.4 mEq/L (ref 3.5–5.1)
Sodium: 140 mEq/L (ref 135–145)
Total Bilirubin: 0.9 mg/dL (ref 0.2–1.2)
Total Protein: 6.4 g/dL (ref 6.0–8.3)

## 2019-10-31 LAB — LIPID PANEL
Cholesterol: 189 mg/dL (ref 0–200)
HDL: 65.6 mg/dL (ref >39.00)
LDL Cholesterol: 109 mg/dL — ABNORMAL HIGH (ref 0–99)
NonHDL: 123.61
Total CHOL/HDL Ratio: 3
Triglycerides: 73 mg/dL (ref 0.0–149.0)
VLDL: 14.6 mg/dL (ref 0.0–40.0)

## 2019-10-31 NOTE — Progress Notes (Signed)
HPI:  BethBeth Cantu is a 86 y.o. female, who is here today with her daughter for her routine physical.  Last CPE: 09/13/17  Regular exercise 3 or more time per week: She is active around her house with chores. Following a healthy diet: Yes. She lives alone. Her daughter lives close by.  Chronic medical problems: Back pain, OA,HLD,vein disease with venous stasis dermatitis.  She takesTylenol for upper back pain and OA. Back pain also alleviated by not wearing a bra.  Immunization History  Administered Date(s) Administered  . Influenza, High Dose Seasonal PF 04/14/2016  . PFIZER SARS-COV-2 Vaccination 06/28/2019, 07/16/2019  . Pneumococcal Conjugate-13 02/05/2015  . Pneumococcal Polysaccharide-23 02/25/2009   Most ADL's and IADL's independent except transfer, needs a cane and sometimes uses a walker. She does not drive frequent, her daughter helps  Her has has concerns today. She would like to check urine, Beth Cantu denies dysuria,changes in urinary frequency,or gross hematuria. Daughter is concerned because she has had UTI's in the past. Nocturia x 3-4 not every night. Occasional urine incontinence.  Unstable gait: She has no had falls but afraid of doing so.  TIA: She is on Plavix 75 mg daily. She has been on Plavix since 2003. She has a medical alert device all the time.  + IP hands and foot pain. She follows with podiatrist.  Negative for severe/frequent headache, visual changes, chest pain, dyspnea, palpitation, claudication, focal weakness, or worsening edema.  Intertrigo: Erythematous,pruritic rash under breast. Intermittent, exacerbated by heat. Topical nystatin helps. No fever,chills,local edema or tenderness.  HLD: She is on non pharmacologic treatment.  Component     Latest Ref Rng & Units 09/13/2017  Cholesterol     0 - 200 mg/dL 174  Triglycerides     0.0 - 149.0 mg/dL 46.0  HDL Cholesterol     >39.00 mg/dL 65.00  VLDL     0.0 - 40.0 mg/dL 9.2    LDL (calc)     0 - 99 mg/dL 100 (H)  Total CHOL/HDL Ratio      3  NonHDL      109.38   LE edema/vVenous stasis dermatitis: Edema and erythema, exacerbated by prolonged walking/standing. Toe nails deformities. Edema is extending to toes, left.  Review of Systems  Constitutional: Negative for appetite change, fatigue and fever.  HENT: Negative for hearing loss, mouth sores and sore throat.   Eyes: Negative for redness and visual disturbance.  Respiratory: Negative for cough, shortness of breath and wheezing.   Cardiovascular: Positive for leg swelling. Negative for chest pain.  Gastrointestinal: Negative for abdominal pain, nausea and vomiting.       No changes in bowel habits.  Endocrine: Negative for cold intolerance, heat intolerance, polydipsia, polyphagia and polyuria.  Genitourinary: Negative for decreased urine volume, dysuria and hematuria.  Musculoskeletal: Positive for arthralgias, back pain and gait problem.  Skin: Negative for color change and wound.  Neurological: Negative for syncope, weakness and headaches.  Psychiatric/Behavioral: Negative for confusion and sleep disturbance. The patient is not nervous/anxious.   All other systems reviewed and are negative.  Current Outpatient Medications on File Prior to Visit  Medication Sig Dispense Refill  . clopidogrel (PLAVIX) 75 MG tablet Take 1 tablet (75 mg total) by mouth daily. 90 tablet 3  . Ergocalciferol (VITAMIN D2) 400 units TABS Take by mouth.    . Multiple Vitamins-Minerals (PRESERVISION AREDS 2 PO) Take by mouth.     No current facility-administered medications on file  prior to visit.   Past Medical History:  Diagnosis Date  . Varicose veins    No past surgical history on file.  Allergies  Allergen Reactions  . Aspirin     Other reaction(s): Other (See Comments) Other Reaction: GI UPSET  . Erythromycin     Other reaction(s): Other (See Comments) Makes her sick    Family History  Problem Relation  Age of Onset  . Cancer Father        bladder    Social History   Socioeconomic History  . Marital status: Widowed    Spouse name: Not on file  . Number of children: Not on file  . Years of education: Not on file  . Highest education level: Not on file  Occupational History  . Not on file  Tobacco Use  . Smoking status: Never Smoker  . Smokeless tobacco: Never Used  Substance and Sexual Activity  . Alcohol use: No    Alcohol/week: 0.0 standard drinks  . Drug use: No  . Sexual activity: Not Currently  Other Topics Concern  . Not on file  Social History Narrative  . Not on file   Social Determinants of Health   Financial Resource Strain:   . Difficulty of Paying Living Expenses:   Food Insecurity:   . Worried About Charity fundraiser in the Last Year:   . Arboriculturist in the Last Year:   Transportation Needs:   . Film/video editor (Medical):   Marland Kitchen Lack of Transportation (Non-Medical):   Physical Activity:   . Days of Exercise per Week:   . Minutes of Exercise per Session:   Stress:   . Feeling of Stress :   Social Connections:   . Frequency of Communication with Friends and Family:   . Frequency of Social Gatherings with Friends and Family:   . Attends Religious Services:   . Active Member of Clubs or Organizations:   . Attends Archivist Meetings:   Marland Kitchen Marital Status:     Vitals:   10/31/19 1018  BP: 132/80  Pulse: 89  Resp: 16  Temp: 97.7 F (36.5 C)  SpO2: 97%   Body mass index is 30.42 kg/m.  Wt Readings from Last 3 Encounters:  10/31/19 177 lb 4 oz (80.4 kg)  10/15/17 176 lb (79.8 kg)  10/08/17 178 lb 8 oz (81 kg)   Physical Exam  Nursing note and vitals reviewed. Constitutional: She is oriented to person, place, and time. She appears well-developed. No distress.  HENT:  Head: Normocephalic and atraumatic.  Right Ear: Tympanic membrane, external ear and ear canal normal.  Left Ear: Tympanic membrane, external ear and ear  canal normal.  Mouth/Throat: Uvula is midline, oropharynx is clear and moist and mucous membranes are normal.  Eyes: Pupils are equal, round, and reactive to light. Conjunctivae are normal.  Cardiovascular: Normal rate and regular rhythm.  Murmur (soft SEM RUSB) heard. Pulses:      Dorsalis pedis pulses are 2+ on the right side and 2+ on the left side.  Respiratory: Effort normal and breath sounds normal. No respiratory distress.  GI: Soft. She exhibits no mass. There is no hepatomegaly. There is no abdominal tenderness.  Musculoskeletal:        General: Edema (Trace pitting LE edema,L>R.1+ pitting pedal edema right.) present.     Comments: No signs of synovitis appreciated.  Lymphadenopathy:    She has no cervical adenopathy.  Right: No supraclavicular adenopathy present.       Left: No supraclavicular adenopathy present.  Neurological: She is alert and oriented to person, place, and time. She has normal strength. No cranial nerve deficit.  Unstable gait,assisted with a cane. Get up and go > 15 seconds.  Skin: Skin is warm. Rash noted. Rash is maculopapular. There is erythema.  Under right breast , no tenderness , drainage, or edema.  Psychiatric: She has a normal mood and affect.  Well groomed, good eye contact.   ASSESSMENT AND PLAN:  Beth. MITRA DULING was here today annual physical examination.  Orders Placed This Encounter  Procedures  . Lipid panel  . Comprehensive metabolic panel  . Ambulatory referral to [place]   Lab Results  Component Value Date   CREATININE 0.51 10/31/2019   BUN 17 10/31/2019   NA 140 10/31/2019   K 4.4 10/31/2019   CL 103 10/31/2019   CO2 30 10/31/2019   Lab Results  Component Value Date   ALT 12 10/31/2019   AST 16 10/31/2019   ALKPHOS 64 10/31/2019   BILITOT 0.9 10/31/2019   Lab Results  Component Value Date   CHOL 189 10/31/2019   HDL 65.60 10/31/2019   LDLCALC 109 (H) 10/31/2019   TRIG 73.0 10/31/2019   CHOLHDL 3  10/31/2019   Routine general medical examination at a health care facility We discussed the importance of regular physical activity and healthy diet for prevention of complications. Preventive guidelines reviewed. Vaccination up to date. Fat prevention. Ca++ and vit D supplementation recommended. Next CPE in a year.  Pure hypercholesterolemia Continue non pharmacologic treatment. Further recommendations according to lab results.  TIA (transient ischemic attack) She wants to continue Plavix, some side effects discussed. She is not on statin medication,she does not think   Venous stasis dermatitis of left lower extremity LE elevation and compression stocking recommended. Appropriate skin care. Monitor for signs of infection.  Intertrigo We discussed Dx, prognosis. Continue topical nystatin daily as needed. Keep areas dry and clean with soap and water.  -     nystatin cream (MYCOSTATIN); Apply 1 application topically 2 (two) times daily.  Generalized osteoarthritis of multiple sites Continue Tylenol 500 mg tid as needed. Fall precautions. PT will be arranged.  Overactive bladder She is not interested in urine test today and or pharmacologic treatment.  Return in 6 months (on 05/02/2020) for LE edema and venous stasis + AWV.  Pallas Wahlert G. Martinique, MD  [facility]. [address] office.   A few things to remember from today's visit:  If you need refills please call your pharmacy. Do not use My Chart to request refills or for acute issues that need immediate attention.   A few tips:  -As we age balance is not as good as it was, so there is a higher risks for falls. Please remove small rugs and furniture that is "in your way" and could increase the risk of falls. Stretching exercises may help with fall prevention: Yoga and Tai Chi are some examples. Low impact exercise is better, so you are not very achy the next day.  -Sun screen and avoidance of direct sun light  recommended. Caution with dehydration, if working outdoors be sure to drink enough fluids.  - Some medications are not safe as we age, increases the risk of side effects and can potentially interact with other medication you are also taken;  including some of over the counter medications. Be sure to let me  know when you start a new medication even if it is a dietary/vitamin supplement.   -Healthy diet low in red meet/animal fat and sugar + regular physical activity is recommended.   Stasis Dermatitis Stasis dermatitis is a long-term (chronic) skin condition that happens when veins can no longer pump blood back to the heart (poor circulation). This condition causes a red or brown scaly rash or sores (ulcers) from the pooling of blood (stasis). This condition usually affects the lower legs. It may affect one leg or both legs. Without treatment, severe stasis dermatitis can lead to other skin conditions and infections. What are the causes? This condition is caused by poor circulation. What increases the risk? You are more likely to develop this condition if:  You are not very active.  You stand for long periods of time.  You have veins that have become enlarged and twisted (varicose veins).  You have leg veins that are not strong enough to send blood back to the heart (venous insufficiency).  You have had a blood clot.  You have been pregnant many times.  You have had vein surgery.  You are obese.  You have heart or kidney failure.  You are 69 years of age or older.  You have had injuries to your legs in the past. What are the signs or symptoms? Common early symptoms of this condition include:  Itchiness in one or both of your legs.  Swelling in your ankle or leg. This might get better overnight but be worse again during the day.  Skin that looks thin on your ankle and leg.  Red or brown marks that develop slowly.  Skin that is dry, cracked, or easily irritated.  Red,  swollen skin that is sore or has a burning feeling.  An achy or heavy feeling after you walk or stand for long periods of time.  Pain. Later and more severe symptoms of this condition include:  Skin that looks shiny.  Small, open sores (ulcers). These are often red or purple and leak fluid.  Skin that feels hard.  Severe itching.  A change in the shape or color of your lower legs.  Severe pain.  Difficulty walking. How is this diagnosed? This condition may be diagnosed based on:  Your symptoms and medical history.  A physical exam. You may also have tests, including:  Blood tests.  Imaging tests to check blood flow (Doppler ultrasound).  Allergy tests. You may need to see a health care provider who specializes in skin diseases (dermatologist). How is this treated? This condition may be treated with:  Compression stockings or an elastic wrap to improve circulation.  Medicines, such as: ? Corticosteroid creams and ointments. ? Non-corticosteroid medicines applied to the skin (topical). ? Medicine to reduce swelling in the legs (diuretics). ? Antibiotics. ? Medicine to relieve itching (antihistamines).  A bandage (dressing).  A wrap that contains zinc and gelatin (Unna boot). Follow these instructions at home: Skin care  Moisturize your skin as told by your health care provider. Do not use moisturizers with fragrance. This can irritate your skin.  Apply a cool, wet cloth (cool compress) to the affected areas.  Do not scratch your skin.  Do not rub your skin dry after a bath or shower. Gently pat your skin dry.  Do not use scented soaps, detergents, or perfumes. Medicines  Take or use over-the-counter and prescription medicines only as told by your health care provider.  If you were prescribed an antibiotic medicine,  take or use it as told by your health care provider. Do not stop taking or using the antibiotic even if your condition  improves. Activity  Walk as told by your health care provider. Walking increases blood flow.  Do calf and ankle exercises throughout the day as told by your health care provider. This will help increase blood flow.  Raise (elevate) your legs above the level of your heart when you are sitting or lying down. Lifestyle  Work with your health care provider to lose weight, if needed.  Do not cross your legs when you sit.  Do not stand or sit in one position for long periods of time.  Wear comfortable, loose-fitting clothing. Circulation in your legs will be worse if you wear tight pants, belts, and waistbands.  Do not use any products that contain nicotine or tobacco, such as cigarettes, e-cigarettes, and chewing tobacco. If you need help quitting, ask your health care provider. General instructions  If you were asked to use one of the following to help with your condition, follow instructions from your health care provider on how to: ? Remove and change any dressing. ? Wear compression stockings. These stockings help to prevent blood clots and reduce swelling in your legs. ? Wear the [employer].  Keep all follow-up visits as told by your health care provider. This is important. Contact a health care provider if:  Your condition does not improve with treatment.  Your condition gets worse.  You have signs of infection in the affected area. Watch for: ? Swelling. ? Tenderness. ? Redness. ? Soreness. ? Warmth.  You have a fever. Get help right away if:  You notice red streaks coming from the affected area.  Your bone or joint underneath the affected area becomes painful after the skin has healed.  The affected area turns darker.  You feel a deep pain in your leg or groin.  You are short of breath. Summary  Stasis dermatitis is a long-term (chronic) skin condition that happens when veins can no longer pump blood back to the heart (poor circulation).  Wear compression  stockings as told by your health care provider. These stockings help to prevent blood clots and reduce swelling in your legs.  Follow instructions from your health care provider about activity, medicines, and lifestyle.  Contact a health care provider if you have a fever or have signs of infection in the affected area.  Keep all follow-up visits as told by your health care provider. This is important. This information is not intended to replace advice given to you by your health care provider. Make sure you discuss any questions you have with your health care provider. Document Revised: 10/25/2017 Document Reviewed: 10/25/2017 Elsevier Patient Education  [place].

## 2019-10-31 NOTE — Patient Instructions (Addendum)
A few things to remember from today's visit:   Pure hypercholesterolemia  If you need refills please call your pharmacy. Do not use My Chart to request refills or for acute issues that need immediate attention.   A few tips:  -As we age balance is not as good as it was, so there is a higher risks for falls. Please remove small rugs and furniture that is "in your way" and could increase the risk of falls. Stretching exercises may help with fall prevention: Yoga and Tai Chi are some examples. Low impact exercise is better, so you are not very achy the next day.  -Sun screen and avoidance of direct sun light recommended. Caution with dehydration, if working outdoors be sure to drink enough fluids.  - Some medications are not safe as we age, increases the risk of side effects and can potentially interact with other medication you are also taken;  including some of over the counter medications. Be sure to let me know when you start a new medication even if it is a dietary/vitamin supplement.   -Healthy diet low in red meet/animal fat and sugar + regular physical activity is recommended.   Stasis Dermatitis Stasis dermatitis is a long-term (chronic) skin condition that happens when veins can no longer pump blood back to the heart (poor circulation). This condition causes a red or brown scaly rash or sores (ulcers) from the pooling of blood (stasis). This condition usually affects the lower legs. It may affect one leg or both legs. Without treatment, severe stasis dermatitis can lead to other skin conditions and infections. What are the causes? This condition is caused by poor circulation. What increases the risk? You are more likely to develop this condition if:  You are not very active.  You stand for long periods of time.  You have veins that have become enlarged and twisted (varicose veins).  You have leg veins that are not strong enough to send blood back to the heart (venous  insufficiency).  You have had a blood clot.  You have been pregnant many times.  You have had vein surgery.  You are obese.  You have heart or kidney failure.  You are 72 years of age or older.  You have had injuries to your legs in the past. What are the signs or symptoms? Common early symptoms of this condition include:  Itchiness in one or both of your legs.  Swelling in your ankle or leg. This might get better overnight but be worse again during the day.  Skin that looks thin on your ankle and leg.  Red or brown marks that develop slowly.  Skin that is dry, cracked, or easily irritated.  Red, swollen skin that is sore or has a burning feeling.  An achy or heavy feeling after you walk or stand for long periods of time.  Pain. Later and more severe symptoms of this condition include:  Skin that looks shiny.  Small, open sores (ulcers). These are often red or purple and leak fluid.  Skin that feels hard.  Severe itching.  A change in the shape or color of your lower legs.  Severe pain.  Difficulty walking. How is this diagnosed? This condition may be diagnosed based on:  Your symptoms and medical history.  A physical exam. You may also have tests, including:  Blood tests.  Imaging tests to check blood flow (Doppler ultrasound).  Allergy tests. You may need to see a health care provider who specializes  in skin diseases (dermatologist). How is this treated? This condition may be treated with:  Compression stockings or an elastic wrap to improve circulation.  Medicines, such as: ? Corticosteroid creams and ointments. ? Non-corticosteroid medicines applied to the skin (topical). ? Medicine to reduce swelling in the legs (diuretics). ? Antibiotics. ? Medicine to relieve itching (antihistamines).  A bandage (dressing).  A wrap that contains zinc and gelatin (Unna boot). Follow these instructions at home: Skin care  Moisturize your skin as  told by your health care provider. Do not use moisturizers with fragrance. This can irritate your skin.  Apply a cool, wet cloth (cool compress) to the affected areas.  Do not scratch your skin.  Do not rub your skin dry after a bath or shower. Gently pat your skin dry.  Do not use scented soaps, detergents, or perfumes. Medicines  Take or use over-the-counter and prescription medicines only as told by your health care provider.  If you were prescribed an antibiotic medicine, take or use it as told by your health care provider. Do not stop taking or using the antibiotic even if your condition improves. Activity  Walk as told by your health care provider. Walking increases blood flow.  Do calf and ankle exercises throughout the day as told by your health care provider. This will help increase blood flow.  Raise (elevate) your legs above the level of your heart when you are sitting or lying down. Lifestyle  Work with your health care provider to lose weight, if needed.  Do not cross your legs when you sit.  Do not stand or sit in one position for long periods of time.  Wear comfortable, loose-fitting clothing. Circulation in your legs will be worse if you wear tight pants, belts, and waistbands.  Do not use any products that contain nicotine or tobacco, such as cigarettes, e-cigarettes, and chewing tobacco. If you need help quitting, ask your health care provider. General instructions  If you were asked to use one of the following to help with your condition, follow instructions from your health care provider on how to: ? Remove and change any dressing. ? Wear compression stockings. These stockings help to prevent blood clots and reduce swelling in your legs. ? Wear the [employer].  Keep all follow-up visits as told by your health care provider. This is important. Contact a health care provider if:  Your condition does not improve with treatment.  Your condition gets  worse.  You have signs of infection in the affected area. Watch for: ? Swelling. ? Tenderness. ? Redness. ? Soreness. ? Warmth.  You have a fever. Get help right away if:  You notice red streaks coming from the affected area.  Your bone or joint underneath the affected area becomes painful after the skin has healed.  The affected area turns darker.  You feel a deep pain in your leg or groin.  You are short of breath. Summary  Stasis dermatitis is a long-term (chronic) skin condition that happens when veins can no longer pump blood back to the heart (poor circulation).  Wear compression stockings as told by your health care provider. These stockings help to prevent blood clots and reduce swelling in your legs.  Follow instructions from your health care provider about activity, medicines, and lifestyle.  Contact a health care provider if you have a fever or have signs of infection in the affected area.  Keep all follow-up visits as told by your health care provider. This  is important. This information is not intended to replace advice given to you by your health care provider. Make sure you discuss any questions you have with your health care provider. Document Revised: 10/25/2017 Document Reviewed: 10/25/2017 Elsevier Patient Education  [place].

## 2019-11-04 ENCOUNTER — Encounter: Payer: Self-pay | Admitting: Family Medicine

## 2019-11-04 MED ORDER — NYSTATIN 100000 UNIT/GM EX CREA: 1.0000 "application " | TOPICAL_CREAM | Freq: Two times a day (BID) | CUTANEOUS | 4 refills | Status: DC

## 2019-11-07 ENCOUNTER — Telehealth: Payer: Self-pay | Admitting: Family Medicine

## 2019-11-07 ENCOUNTER — Other Ambulatory Visit: Payer: Self-pay

## 2019-11-07 DIAGNOSIS — G459 Transient cerebral ischemic attack, unspecified: Secondary | ICD-10-CM

## 2019-11-07 MED ORDER — CLOPIDOGREL BISULFATE 75 MG PO TABS: 75.0000 mg | ORAL_TABLET | Freq: Every day | ORAL | 3 refills | Status: DC

## 2019-11-07 NOTE — Telephone Encounter (Signed)
Pt was returning Kendra's phone call about lab results. Pt would like it mailed to her instead at [address]  [place] [address]

## 2019-11-07 NOTE — Telephone Encounter (Signed)
Left detailed message for patient to return call to office concerning recommendations per Dr. [place] to start medication. Lab results mailed to patient per patient requests.

## 2020-03-11 ENCOUNTER — Telehealth: Payer: Self-pay | Admitting: Family Medicine

## 2020-03-11 NOTE — Telephone Encounter (Signed)
Left message for patient to call back and schedule Medicare Annual Wellness Visit (AWV) either virtually or in office.  Last AWV 09/13/17; please schedule at anytime with [facility] Nurse Health Advisor 2.  This should be a 45 minute visit.

## 2020-03-23 ENCOUNTER — Ambulatory Visit: Payer: Medicare HMO

## 2020-04-04 IMAGING — US US EXTREM LOW VENOUS*L*
1 series · 13 of 24 positions shown · non-contrast
Comparison: None.

CLINICAL DATA: Left lower extremity swelling. This is chronic but
worse yesterday. Color changes in varicose veins.



[Series 1: us extrem low venous*left* · 0.09mm/px · 26 acquisitions, 13 frames shown]
[im 1/26]
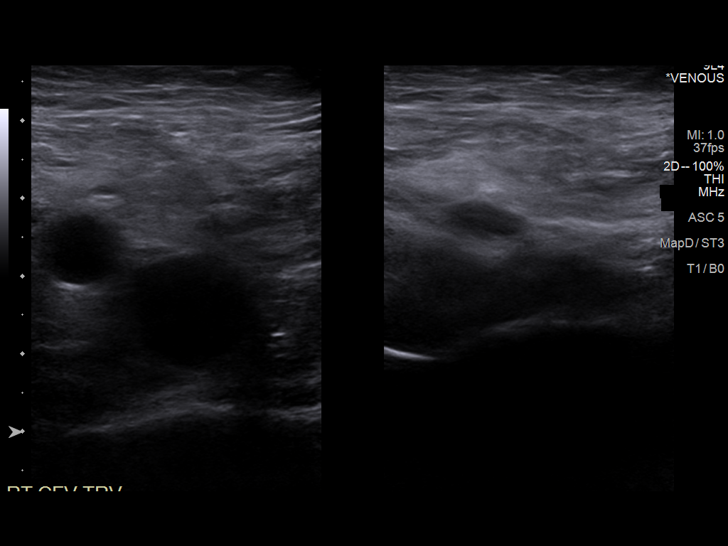
[im 3/26]
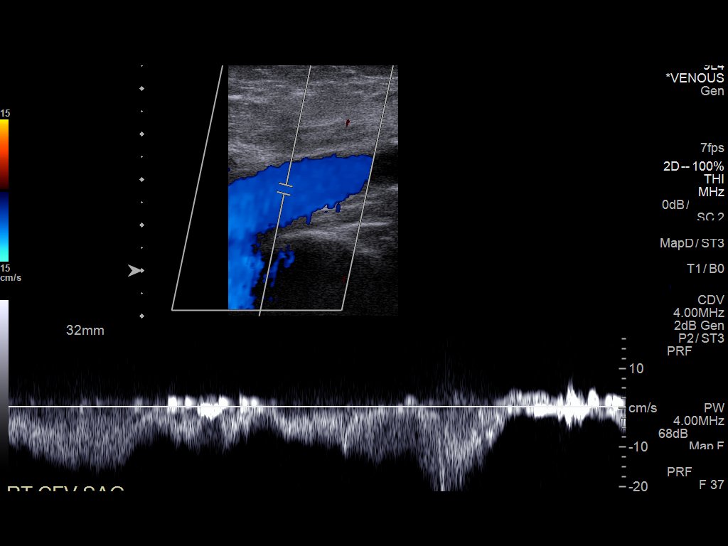
[im 5/26]
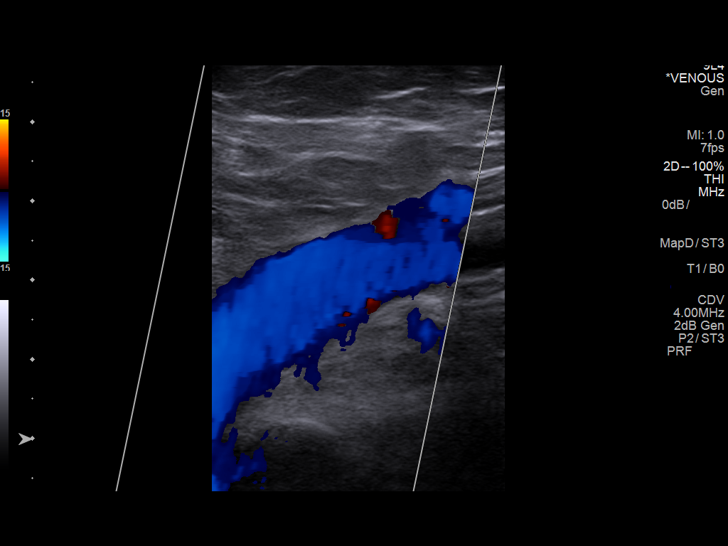
[im 8/26]
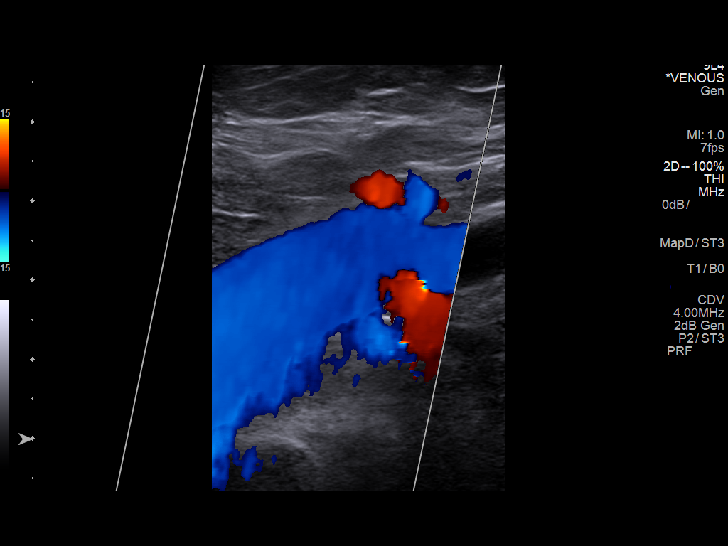
[im 10/26]
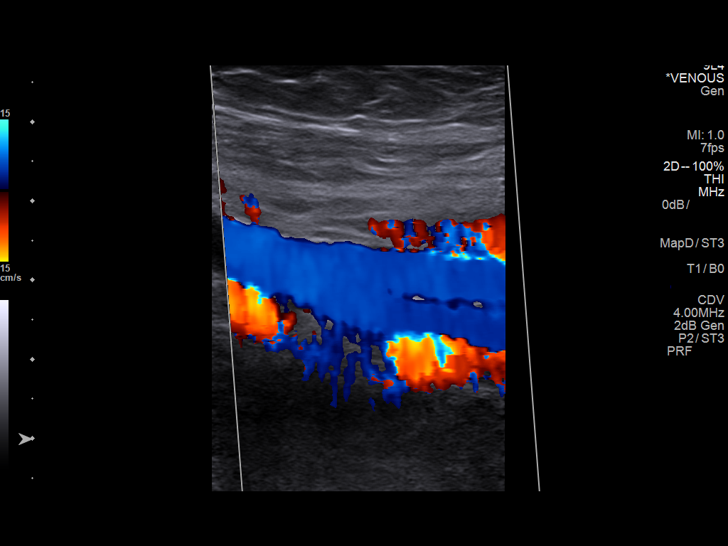
[im 12/26]
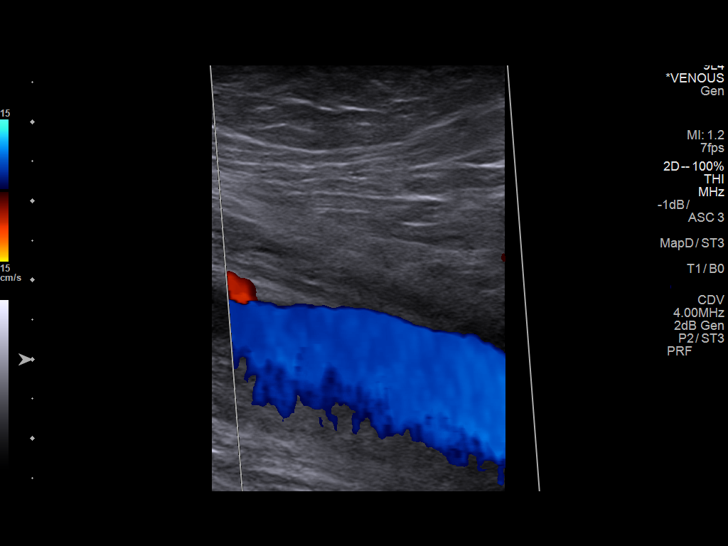
[im 15/26]
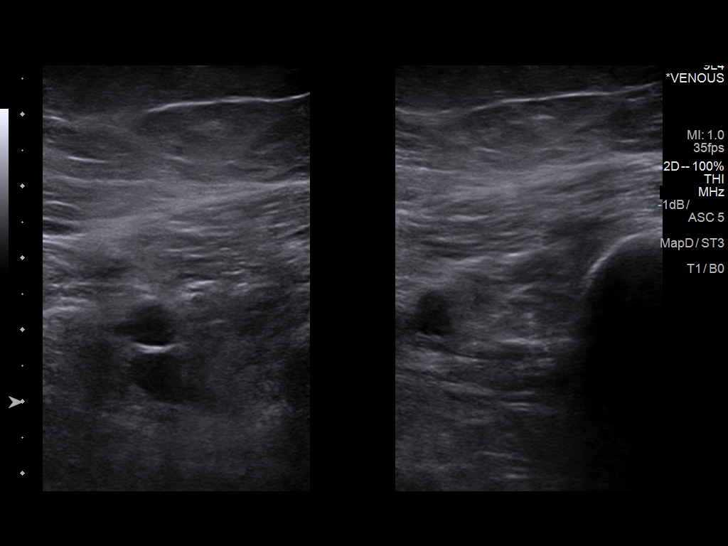
[im 16/26]
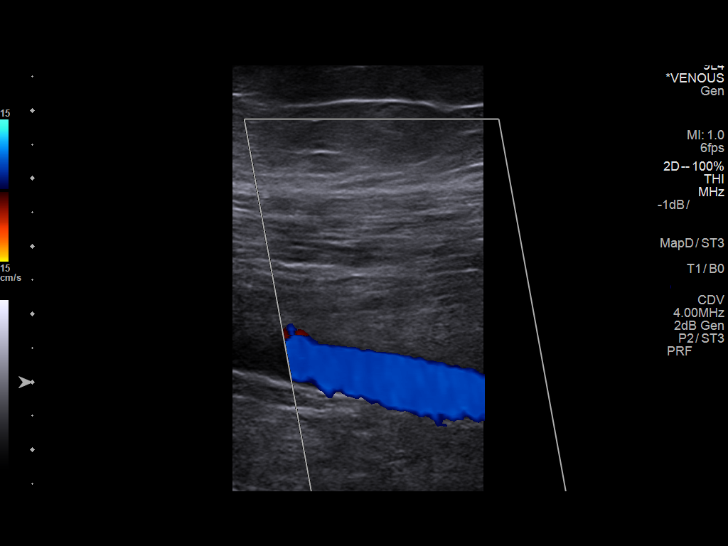
[im 18/26]
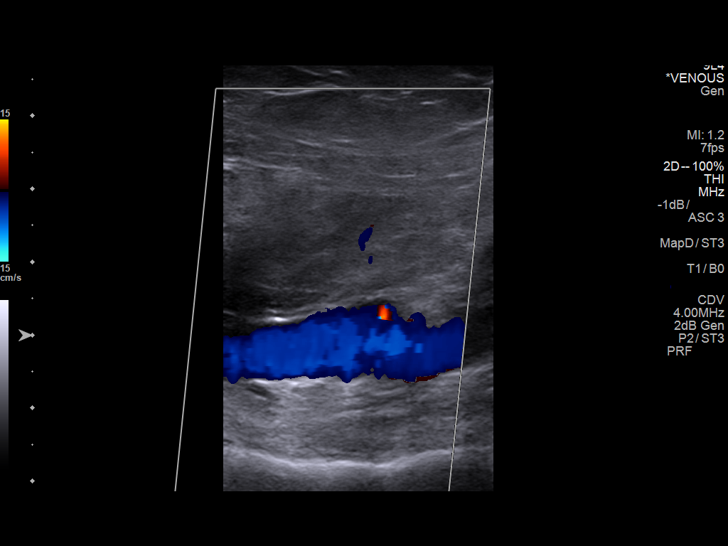
[im 20/26]
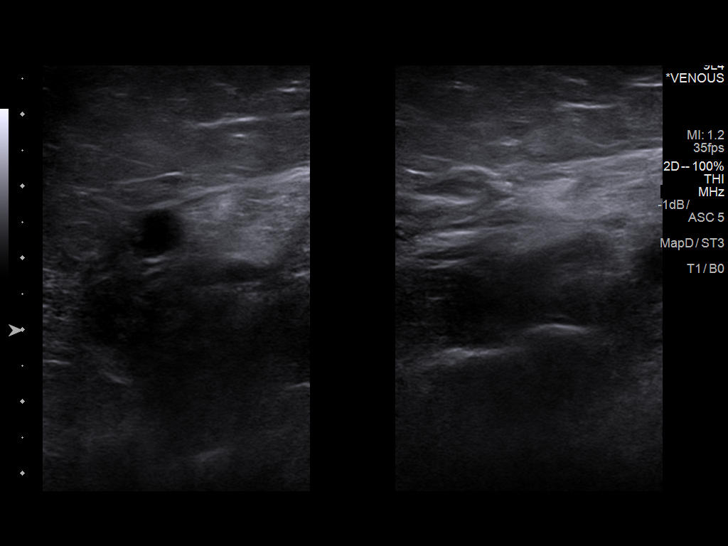
[im 22/26]
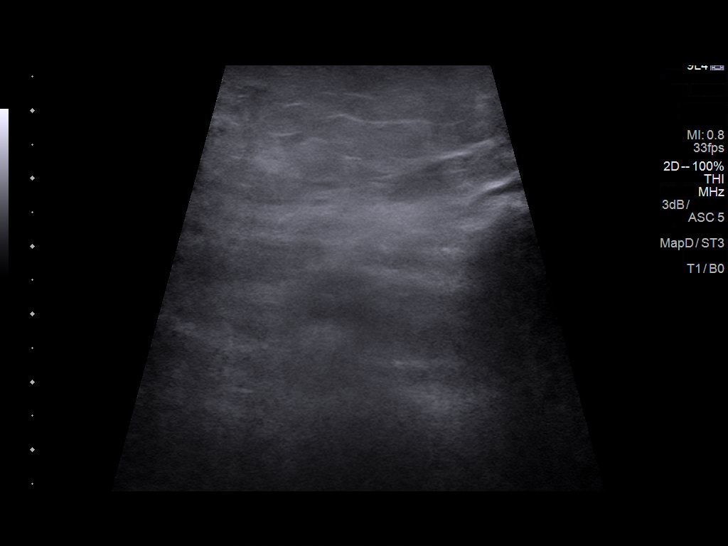
[im 23/26]
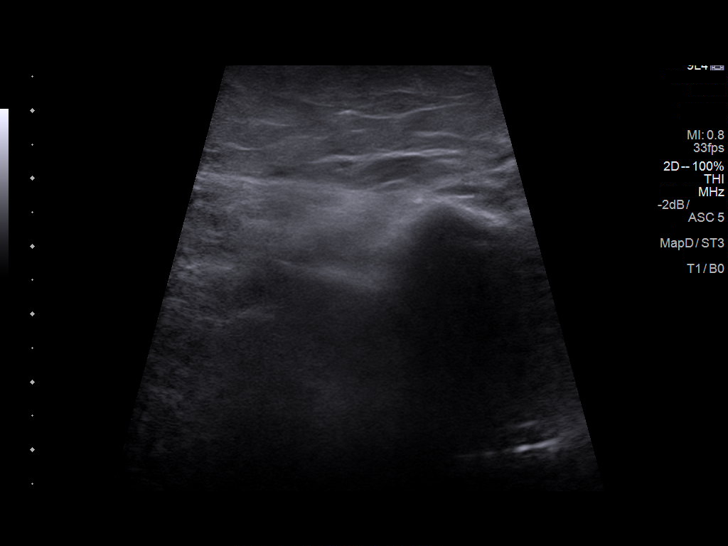
[im 26/26]
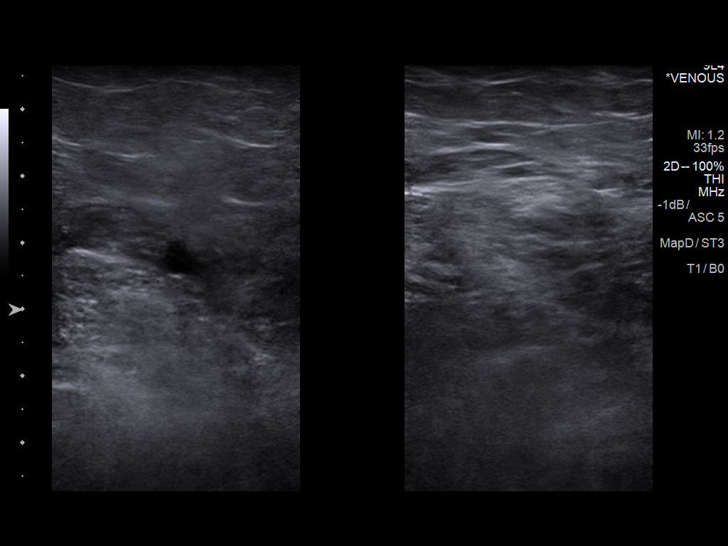

[13 of 24 positions shown; findings below may reference images not displayed]

FINDINGS: Contralateral Common Femoral Vein: Respiratory phasicity is normal
and symmetric with the symptomatic side. No evidence of thrombus.
Normal compressibility.

Common Femoral Vein: No evidence of thrombus. Normal
compressibility, respiratory phasicity and response to augmentation.

Saphenofemoral Junction: No evidence of thrombus. Normal
compressibility and flow on color Doppler imaging.

Profunda Femoral Vein: No evidence of thrombus. Normal
compressibility and flow on color Doppler imaging.

Femoral Vein: No evidence of thrombus. Normal compressibility,
respiratory phasicity and response to augmentation.

Popliteal Vein: No evidence of thrombus. Normal compressibility,
respiratory phasicity and response to augmentation.

Calf Veins: No evidence of thrombus. Normal compressibility and flow
on color Doppler imaging.

Superficial Great Saphenous Vein: No evidence of thrombus. Normal
compressibility.

Other Findings: There is a small amount of subcutaneous soft tissue
edema in the ankle region.
IMPRESSION: No evidence of deep venous thrombosis.

## 2020-04-06 ENCOUNTER — Ambulatory Visit: Payer: Medicare HMO | Attending: Internal Medicine

## 2020-04-06 DIAGNOSIS — Z23 Encounter for immunization: Secondary | ICD-10-CM

## 2020-04-06 NOTE — Progress Notes (Signed)
   Covid-19 Vaccination Clinic  Name:  Beth Cantu    MRN: [id number] DOB: [birth year]  04/06/2020  Beth Cantu was observed post Covid-19 immunization for 15 minutes without incident. She was provided with Vaccine Information Sheet and instruction to access the V-Safe system.   Beth Cantu was instructed to call 911 with any severe reactions post vaccine: Marland Cantu Difficulty breathing  . Swelling of face and throat  . A fast heartbeat  . A bad rash all over body  . Dizziness and weakness

## 2020-06-28 ENCOUNTER — Telehealth: Payer: Self-pay | Admitting: Family Medicine

## 2020-06-28 NOTE — Telephone Encounter (Signed)
Left message for patient to call back and schedule Medicare Annual Wellness Visit (AWV) either virtually or in office.   Last AWV 09/13/17  please schedule at anytime with [facility] Nurse Health Advisor 1 or 2   This should be a 45 minute visit.

## 2020-08-26 ENCOUNTER — Telehealth: Payer: Self-pay | Admitting: Family Medicine

## 2020-08-26 NOTE — Telephone Encounter (Signed)
Left message for patient to call back and schedule Medicare Annual Wellness Visit (AWV) either virtually or in office. No detailed message left    Last AWV 09/13/17  please schedule at anytime with [facility] Nurse Health Advisor 1 or 2   This should be a 45 minute visit.

## 2020-09-13 ENCOUNTER — Ambulatory Visit: Payer: Medicare HMO | Admitting: Family Medicine

## 2020-10-29 ENCOUNTER — Other Ambulatory Visit: Payer: Self-pay

## 2020-10-30 ENCOUNTER — Encounter: Payer: Self-pay | Admitting: Family Medicine

## 2020-10-30 ENCOUNTER — Ambulatory Visit (INDEPENDENT_AMBULATORY_CARE_PROVIDER_SITE_OTHER): Payer: Medicare HMO | Admitting: Family Medicine

## 2020-10-30 VITALS — BP 120/70 | HR 100 | Resp 16 | Ht 64.0 in | Wt 167.0 lb

## 2020-10-30 DIAGNOSIS — E7800 Pure hypercholesterolemia, unspecified: Secondary | ICD-10-CM

## 2020-10-30 DIAGNOSIS — I872 Venous insufficiency (chronic) (peripheral): Secondary | ICD-10-CM

## 2020-10-30 DIAGNOSIS — Z0000 Encounter for general adult medical examination without abnormal findings: Secondary | ICD-10-CM | POA: Diagnosis not present

## 2020-10-30 DIAGNOSIS — M549 Dorsalgia, unspecified: Secondary | ICD-10-CM | POA: Diagnosis not present

## 2020-10-30 DIAGNOSIS — G459 Transient cerebral ischemic attack, unspecified: Secondary | ICD-10-CM | POA: Diagnosis not present

## 2020-10-30 LAB — BASIC METABOLIC PANEL WITH GFR
BUN: 20 mg/dL (ref 6–23)
CO2: 28 meq/L (ref 19–32)
Calcium: 9.5 mg/dL (ref 8.4–10.5)
Chloride: 102 meq/L (ref 96–112)
Creatinine, Ser: 0.47 mg/dL (ref 0.40–1.20)
GFR: 81.91 mL/min
Glucose, Bld: 91 mg/dL (ref 70–99)
Potassium: 4 meq/L (ref 3.5–5.1)
Sodium: 140 meq/L (ref 135–145)

## 2020-10-30 LAB — LIPID PANEL
Cholesterol: 179 mg/dL (ref 0–200)
HDL: 62.5 mg/dL (ref >39.00)
LDL Cholesterol: 104 mg/dL — ABNORMAL HIGH (ref 0–99)
NonHDL: 116.74
Total CHOL/HDL Ratio: 3
Triglycerides: 66 mg/dL (ref 0.0–149.0)
VLDL: 13.2 mg/dL (ref 0.0–40.0)

## 2020-10-30 LAB — CBC
HCT: 40.2 % (ref 36.0–46.0)
Hemoglobin: 13.7 g/dL (ref 12.0–15.0)
MCHC: 34 g/dL (ref 30.0–36.0)
MCV: 92.6 fl (ref 78.0–100.0)
Platelets: 213 10*3/uL (ref 150.0–400.0)
RBC: 4.34 Mil/uL (ref 3.87–5.11)
RDW: 13.7 % (ref 11.5–15.5)
WBC: 9.3 10*3/uL (ref 4.0–10.5)

## 2020-10-30 NOTE — Progress Notes (Signed)
HPI: Beth Cantu is a 87 y.o. female, who is here today with her daughter for her routine physical.  Last CPE: 10/31/19.  Regular exercise 3 or more time per week: She does not exercise regularly but she is active with chores around her house. She tries to follow a healthful diet, eats Lean Ciusine daily,plenty of fruit,vegetables. She drinks milk and yogurt. She has lost some wt sine her last visit,attributed to increased activity. She lives alone, planning to move to independent living.She is getting her house ready for sale. She needs forms that need to be filled out.  She had a fall in her bedroom about 2 months ago, she was going to sit and miss the chair. She was able to get up with no help. Fell on carpet.  She needs assistance with some ADL's: She uses a cane , not all the time, and has a walker. She wear depends for occasional urine incontinence. Nocturia x 4, sleeps 3-4 hours at the time. She takes 2 hours nap daily.  Drives short distances. She manages her finances.  She fixes her meals and does her laundry.  Chronic medical problems: Back pain,TIA,vein disease,OA,and HLD. Back pain exacerbated by lifting more than 5 Lb wt.  For a few months she has ahd intermittent upper right back pain, intermittently,achy pain. She takes Tylenol. Negative for associated fever,changes in appetite,or CP.  Immunization History  Administered Date(s) Administered  . Influenza, High Dose Seasonal PF 04/14/2016  . PFIZER(Purple Top)SARS-COV-2 Vaccination 06/28/2019, 07/16/2019, 04/06/2020  . Pneumococcal Conjugate-13 02/05/2015  . Pneumococcal Polysaccharide-23 02/25/2009   TIA x 1 many years ago, hx is not clear.Episode was after receiving her flu vaccine and a few days after her mother's death. She was not evaluated in the ER, she does not member having focal weakness,MS changes,or speech changes. She is on Plavix 75 mg daily. She has tolerated medication well. She has not  noted nosebleed,gum bleeding,gross hematuria,melena,or blood in stool. 10/31/2019: TC 189,LDL 109,HLD65,and TG 73.  She is not on statin. She would like to have labs today.  Lab Results  Component Value Date   CREATININE 0.51 10/31/2019   BUN 17 10/31/2019   NA 140 10/31/2019   K 4.4 10/31/2019   CL 103 10/31/2019   CO2 30 10/31/2019   Lab Results  Component Value Date   WBC 5.3 10/06/2017   HGB 12.7 10/06/2017   HCT 37.6 10/06/2017   MCV 88.5 10/06/2017   PLT 213 10/06/2017   Review of Systems  Constitutional: Positive for fatigue. Negative for activity change, appetite change and fever.  HENT: Negative for mouth sores and sore throat.   Eyes: Negative for redness and visual disturbance.  Respiratory: Negative for shortness of breath and wheezing.   Cardiovascular: Negative for palpitations and leg swelling.  Gastrointestinal: Negative for abdominal pain, nausea and vomiting.       Negative for changes in bowel habits.  Endocrine: Negative for cold intolerance and heat intolerance.  Genitourinary: Negative for decreased urine volume, dysuria and hematuria.  Musculoskeletal: Positive for arthralgias, back pain and gait problem.  Neurological: Negative for syncope, weakness and headaches.  Psychiatric/Behavioral: Negative for confusion. The patient is not nervous/anxious.   All other systems reviewed and are negative.  Current Outpatient Medications on File Prior to Visit  Medication Sig Dispense Refill  . clopidogrel (PLAVIX) 75 MG tablet Take 1 tablet (75 mg total) by mouth daily. 90 tablet 3  . Ergocalciferol (VITAMIN D2) 400 units TABS Take  by mouth.    . Multiple Vitamins-Minerals (PRESERVISION AREDS 2 PO) Take by mouth.    . nystatin cream (MYCOSTATIN) Apply 1 application topically 2 (two) times daily. 60 g 4   No current facility-administered medications on file prior to visit.   Past Medical History:  Diagnosis Date  . Back pain   . Hyperlipidemia   . Varicose  veins     History reviewed. No pertinent surgical history.  Allergies  Allergen Reactions  . Aspirin     Other reaction(s): Other (See Comments) Other Reaction: GI UPSET  . Erythromycin     Other reaction(s): Other (See Comments) Makes her sick    Family History  Problem Relation Age of Onset  . Cancer Father        bladder    Social History   Socioeconomic History  . Marital status: Widowed    Spouse name: Not on file  . Number of children: Not on file  . Years of education: Not on file  . Highest education level: Not on file  Occupational History  . Not on file  Tobacco Use  . Smoking status: Never Smoker  . Smokeless tobacco: Never Used  Substance and Sexual Activity  . Alcohol use: No    Alcohol/week: 0.0 standard drinks  . Drug use: No  . Sexual activity: Not Currently  Other Topics Concern  . Not on file  Social History Narrative  . Not on file   Social Determinants of Health   Financial Resource Strain: Not on file  Food Insecurity: Not on file  Transportation Needs: Not on file  Physical Activity: Not on file  Stress: Not on file  Social Connections: Not on file   Vitals:   10/30/20 1031  BP: 120/70  Pulse: 100  Resp: 16  SpO2: 98%   Body mass index is 28.67 kg/m.  Wt Readings from Last 3 Encounters:  10/30/20 167 lb (75.8 kg)  10/31/19 177 lb 4 oz (80.4 kg)  10/15/17 176 lb (79.8 kg)   Physical Exam Vitals and nursing note reviewed.  Constitutional:      General: She is not in acute distress.    Appearance: She is well-developed.  HENT:     Head: Normocephalic and atraumatic.     Right Ear: Hearing normal.     Left Ear: Hearing normal.     Mouth/Throat:     Mouth: Mucous membranes are moist.     Pharynx: Oropharynx is clear. Uvula midline.  Eyes:     Conjunctiva/sclera: Conjunctivae normal.  Cardiovascular:     Rate and Rhythm: Normal rate and regular rhythm.     Heart sounds: No murmur heard.     Comments: LLE  lymphedema. Pulmonary:     Effort: Pulmonary effort is normal. No respiratory distress.     Breath sounds: Normal breath sounds.  Abdominal:     Palpations: Abdomen is soft. There is no mass.     Tenderness: There is no abdominal tenderness.  Musculoskeletal:     Thoracic back: Tenderness present. No bony tenderness.       Back:     Right lower leg: 1+ Pitting Edema present.     Left lower leg: Pitting Edema (Trace LE edema, 1+ peri ankle.) present.  Skin:    General: Skin is warm.     Findings: No erythema or rash.  Neurological:     General: No focal deficit present.     Mental Status: She is alert and oriented  to person, place, and time.     Comments: Mildly unstable gait, short steps at the time, assisted by a cane.  Psychiatric:     Comments: Well groomed, good eye contact.   ASSESSMENT AND PLAN:  Beth Cantu was here today annual physical examination.  Orders Placed This Encounter  Procedures  . Basic metabolic panel  . Lipid panel  . CBC   Lab Results  Component Value Date   WBC 9.3 10/30/2020   HGB 13.7 10/30/2020   HCT 40.2 10/30/2020   MCV 92.6 10/30/2020   PLT 213.0 10/30/2020   Lab Results  Component Value Date   CREATININE 0.47 10/30/2020   BUN 20 10/30/2020   NA 140 10/30/2020   K 4.0 10/30/2020   CL 102 10/30/2020   CO2 28 10/30/2020   Lab Results  Component Value Date   CHOL 179 10/30/2020   HDL 62.50 10/30/2020   LDLCALC 104 (H) 10/30/2020   TRIG 66.0 10/30/2020   CHOLHDL 3 10/30/2020    Routine general medical examination at a health care facility We discussed the importance of following continuing a healthful diet and low impact physical activity. Fall precautions. Preventive guidelines reviewed. Vaccination up to date.  Ca++ and vit D supplementation recommended. Next CPE in a year.  Independent living form completed.  Transient cerebral ischemia, unspecified type Hx is not clear. We discussed dx and side effects of  Plavix, risk of bleeding is high. She wants to continue Plavix and not interested in statin. Instructed about warning signs.  Pure hypercholesterolemia We discussed current guidelines. Continue no pharmacologic treatment. Further recommendations according to lipid panel results.  Upper back pain on right side Musculoskeletal pain. We do not have X ray service here today but [facility] can be [facility] at [facility], she prefers to hold on imaging for now. Continue Tylenol 500 mg 3-4 times per day. OTC Icy hot or asper cream may also help.  Venous stasis dermatitis of left lower extremity Appropriate skin care. Compression stockings will help, hard to put on.  LE elevation a few times through the day.  Return in 1 year (on 10/30/2021) for CPE.   Tristina Sahagian G. [place], MD  [facility]. [place] office.  A few things to remember from today's visit:  Transient cerebral ischemia, unspecified type - Plan: CBC  Pure hypercholesterolemia - Plan: Basic metabolic panel, Lipid panel, CBC  Routine general medical examination at a health care facility  Upper back pain on right side  Venous stasis dermatitis of left lower extremity  If you need refills please call your pharmacy. Do not use My Chart to request refills or for acute issues that need immediate attention.   Salonpass for back pain. Tylenol 500 mg 3-4 times per day.  Please be sure medication list is accurate. If a new problem present, please set up appointment sooner than planned today. A few tips:  -As we age balance is not as good as it was, so there is a higher risks for falls. Please remove small rugs and furniture that is "in your way" and could increase the risk of falls. Stretching exercises may help with fall prevention: Yoga and Tai Chi are some examples. Low impact exercise is better, so you are not very achy the next day.  -Sun screen and avoidance of direct sun light recommended. Caution with dehydration, if working  outdoors be sure to drink enough fluids.  - Some medications are not safe as we age, increases the risk  of side effects and can potentially interact with other medication you are also taken;  including some of over the counter medications. Be sure to let me know when you start a new medication even if it is a dietary/vitamin supplement.   -Healthy diet low in red meet/animal fat and sugar + regular physical activity is recommended.

## 2020-10-30 NOTE — Patient Instructions (Addendum)
A few things to remember from today's visit:  Transient cerebral ischemia, unspecified type - Plan: CBC  Pure hypercholesterolemia - Plan: Basic metabolic panel, Lipid panel, CBC  Routine general medical examination at a health care facility  Upper back pain on right side  Venous stasis dermatitis of left lower extremity  If you need refills please call your pharmacy. Do not use My Chart to request refills or for acute issues that need immediate attention.   Salonpass for back pain. Tylenol 500 mg 3-4 times per day.  Please be sure medication list is accurate. If a new problem present, please set up appointment sooner than planned today. A few tips:  -As we age balance is not as good as it was, so there is a higher risks for falls. Please remove small rugs and furniture that is "in your way" and could increase the risk of falls. Stretching exercises may help with fall prevention: Yoga and Tai Chi are some examples. Low impact exercise is better, so you are not very achy the next day.  -Sun screen and avoidance of direct sun light recommended. Caution with dehydration, if working outdoors be sure to drink enough fluids.  - Some medications are not safe as we age, increases the risk of side effects and can potentially interact with other medication you are also taken;  including some of over the counter medications. Be sure to let me know when you start a new medication even if it is a dietary/vitamin supplement.   -Healthy diet low in red meet/animal fat and sugar + regular physical activity is recommended.

## 2020-11-11 ENCOUNTER — Telehealth: Payer: Self-pay | Admitting: Family Medicine

## 2020-11-11 NOTE — Telephone Encounter (Signed)
Pt is calling in to get lab results sent to the address on file and to also get them verbally call transferred to Sarah for her to give the results.

## 2020-11-11 NOTE — Telephone Encounter (Signed)
Mailed to pt

## 2020-12-13 ENCOUNTER — Other Ambulatory Visit: Payer: Self-pay | Admitting: Family Medicine

## 2020-12-13 DIAGNOSIS — G459 Transient cerebral ischemic attack, unspecified: Secondary | ICD-10-CM

## 2021-01-08 ENCOUNTER — Telehealth: Payer: Self-pay | Admitting: Family Medicine

## 2021-01-08 NOTE — Telephone Encounter (Signed)
Left message for patient to call back and schedule Medicare Annual Wellness Visit (AWV) either virtually or in office.   Last AWV 09/13/17  please schedule at anytime with [facility] Nurse Health Advisor 1 or 2   This should be a 45 minute visit.

## 2021-03-18 ENCOUNTER — Ambulatory Visit: Payer: Medicare HMO

## 2021-03-21 ENCOUNTER — Telehealth: Payer: Self-pay

## 2021-03-21 NOTE — Telephone Encounter (Signed)
Pt informed of the results and verbalized understanding.  ?

## 2021-03-21 NOTE — Telephone Encounter (Signed)
Patient called asking if it is ok to get a flu shot on 10/19 pt stated she received a covid booster on 9/22. Please advise

## 2021-03-21 NOTE — Telephone Encounter (Signed)
Yes, she can. It is ok to have flu vaccine 2 weeks after COVID 19 vaccine. Thanks, BJ

## 2021-04-04 ENCOUNTER — Ambulatory Visit (INDEPENDENT_AMBULATORY_CARE_PROVIDER_SITE_OTHER): Payer: Medicare HMO

## 2021-04-04 DIAGNOSIS — Z0000 Encounter for general adult medical examination without abnormal findings: Secondary | ICD-10-CM

## 2021-04-04 NOTE — Progress Notes (Signed)
Subjective:   Beth Cantu is a 87 y.o. female who presents for Medicare Annual (Subsequent) preventive examination.  I connected with Beth Cantu today by telephone and verified that I am speaking with the correct person using two identifiers. Location patient: home Location provider: work Persons participating in the virtual visit: patient, provider.   I discussed the limitations, risks, security and privacy concerns of performing an evaluation and management service by telephone and the availability of in person appointments. I also discussed with the patient that there may be a patient responsible charge related to this service. The patient expressed understanding and verbally consented to this telephonic visit.    Interactive audio and video telecommunications were attempted between this provider and patient, however failed, due to patient having technical difficulties OR patient did not have access to video capability.  We continued and completed visit with audio only.    Review of Systems           Objective:    Today's Vitals   There is no height or weight on file to calculate BMI.  Advanced Directives 04/04/2021 10/06/2017  Does Patient Have a Medical Advance Directive? Yes Yes  Type of Estate agent of [place];Living will Healthcare Power of [place];Living will  Copy of Healthcare Power of Attorney in Chart? No - copy requested -    Current Medications (verified) Outpatient Encounter Medications as of 04/04/2021  Medication Sig   Ergocalciferol (VITAMIN D2) 400 units TABS Take by mouth.   Multiple Vitamins-Minerals (PRESERVISION AREDS 2 PO) Take by mouth.   nystatin cream (MYCOSTATIN) Apply 1 application topically 2 (two) times daily.   clopidogrel (PLAVIX) 75 MG tablet TAKE 1 TABLET BY MOUTH EVERY DAY (Patient not taking: Reported on 04/04/2021)   No facility-administered encounter medications on file as of 04/04/2021.    Allergies  (verified) Aspirin and Erythromycin   History: Past Medical History:  Diagnosis Date   Back pain    Hyperlipidemia    Varicose veins    History reviewed. No pertinent surgical history. Family History  Problem Relation Age of Onset   Cancer Father        bladder   Social History   Socioeconomic History   Marital status: Widowed    Spouse name: Not on file   Number of children: Not on file   Years of education: Not on file   Highest education level: Not on file  Occupational History   Not on file  Tobacco Use   Smoking status: Never   Smokeless tobacco: Never  Substance and Sexual Activity   Alcohol use: No    Alcohol/week: 0.0 standard drinks   Drug use: No   Sexual activity: Not Currently  Other Topics Concern   Not on file  Social History Narrative   Not on file   Social Determinants of Health   Financial Resource Strain: Low Risk    Difficulty of Paying Living Expenses: Not hard at all  Food Insecurity: No Food Insecurity   Worried About Programme researcher, broadcasting/film/video in the Last Year: Never true   Ran Out of Food in the Last Year: Never true  Transportation Needs: No Transportation Needs   Lack of Transportation (Medical): No   Lack of Transportation (Non-Medical): No  Physical Activity: Insufficiently Active   Days of Exercise per Week: 3 days   Minutes of Exercise per Session: 20 min  Stress: No Stress Concern Present   Feeling of Stress : Not at all  Social Connections: Moderately Isolated   Frequency of Communication with Friends and Family: Twice a week   Frequency of Social Gatherings with Friends and Family: Twice a week   Attends Religious Services: 1 to 4 times per year   Active Member of [employer] or Organizations: No   Attends Banker Meetings: Never   Marital Status: Widowed    Tobacco Counseling Counseling given: Not Answered   Clinical Intake:  Pre-visit preparation completed: Yes  Pain : No/denies pain     Nutritional Risks:  None Diabetes: No  How often do you need to have someone help you when you read instructions, pamphlets, or other written materials from your doctor or pharmacy?: 1 - Never What is the last grade level you completed in school?: college  Diabetic?no   Interpreter Needed?: No  Information entered by :: L.Maximiliano Cromartie,LPN   Activities of Daily Living No flowsheet data found.  Patient Care Team: [place], Betty G, MD as PCP - General (Family Medicine)  Indicate any recent Medical Services you may have received from other than [facility] providers in the past year (date may be approximate).     Assessment:   This is a routine wellness examination for Beth Cantu.  Hearing/Vision screen Vision Screening - Comments:: Annual eye exams   Dietary issues and exercise activities discussed:     Goals Addressed   None    Depression Screen PHQ 2/9 Scores 04/04/2021 04/04/2021 10/30/2020 11/04/2019 09/13/2017 09/13/2017 04/14/2016  PHQ - 2 Score 0 0 0 2 0 0 0  PHQ- 9 Score - - - 11 - - -    Fall Risk Fall Risk  04/04/2021 10/30/2020 10/31/2019 09/13/2017 04/14/2016  Falls in the past year? 0 1 0 No Yes  Number falls in past yr: 0 0 0 - 1  Injury with Fall? 0 0 0 - No  Risk for fall due to : No Fall Risks - Orthopedic patient - Impaired balance/gait;History of fall(s)  Follow up Falls evaluation completed Falls prevention discussed;Education provided Education provided - Education provided    FALL RISK PREVENTION PERTAINING TO THE HOME:  Any stairs in or around the home? No  If so, are there any without handrails? No  Home free of loose throw rugs in walkways, pet beds, electrical cords, etc? Yes  Adequate lighting in your home to reduce risk of falls? Yes   ASSISTIVE DEVICES UTILIZED TO PREVENT FALLS:  Life alert? Yes  Use of a cane, walker or w/c? Yes  Grab bars in the bathroom? Yes  Shower chair or bench in shower? Yes  Elevated toilet seat or a handicapped toilet? Yes     Cognitive Function:     Normal cognitive status assessed by direct observation by this Nurse Health Advisor. No abnormalities found.      Immunizations Immunization History  Administered Date(s) Administered   Influenza, High Dose Seasonal PF 04/14/2016   PFIZER(Purple Top)SARS-COV-2 Vaccination 06/28/2019, 07/16/2019, 04/06/2020   Pneumococcal Conjugate-13 02/05/2015   Pneumococcal Polysaccharide-23 02/25/2009    TDAP status: Due, Education has been provided regarding the importance of this vaccine. Advised may receive this vaccine at local pharmacy or Health Dept. Aware to provide a copy of the vaccination record if obtained from local pharmacy or Health Dept. Verbalized acceptance and understanding.  Flu Vaccine status: Up to date  Pneumococcal vaccine status: Up to date  Covid-19 vaccine status: Completed vaccines  Qualifies for Shingles Vaccine? Yes   Zostavax completed No   Shingrix Completed?: No.  Education has been provided regarding the importance of this vaccine. Patient has been advised to call insurance company to determine out of pocket expense if they have not yet received this vaccine. Advised may also receive vaccine at local pharmacy or Health Dept. Verbalized acceptance and understanding.  Screening Tests Health Maintenance  Topic Date Due   Zoster Vaccines- Shingrix (1 of 2) Never done   COVID-19 Vaccine (4 - Booster for Pfizer series) 06/01/2020   INFLUENZA VACCINE  01/06/2021   Pneumonia Vaccine 11+ Years old  Completed   HPV VACCINES  Aged Out   DEXA SCAN  Discontinued   TETANUS/TDAP  Discontinued    Health Maintenance  Health Maintenance Due  Topic Date Due   Zoster Vaccines- Shingrix (1 of 2) Never done   COVID-19 Vaccine (4 - Booster for Pfizer series) 06/01/2020   INFLUENZA VACCINE  01/06/2021    Colorectal cancer screening: No longer required.   Mammogram status: No longer required due to age.  Bone Density status: Completed due patient declined . Results  reflect: Bone density results: OSTEOPOROSIS. Repeat every 2 years.  Lung Cancer Screening: (Low Dose CT Chest recommended if Age 17-80 years, 30 pack-year currently smoking OR have quit w/in 15years.) does not qualify.   Lung Cancer Screening Referral: n/a  Additional Screening:  Hepatitis C Screening: does not qualify;   Vision Screening: Recommended annual ophthalmology exams for early detection of glaucoma and other disorders of the eye. Is the patient up to date with their annual eye exam?  Yes  Who is the provider or what is the name of the office in which the patient attends annual eye exams? Dr.Hanes  If pt is not established with a provider, would they like to be referred to a provider to establish care? No .   Dental Screening: Recommended annual dental exams for proper oral hygiene  Community Resource Referral / Chronic Care Management: CRR required this visit?  No   CCM required this visit?  No      Plan:     I have personally reviewed and noted the following in the patient's chart:   Medical and social history Use of alcohol, tobacco or illicit drugs  Current medications and supplements including opioid prescriptions.  Functional ability and status Nutritional status Physical activity Advanced directives List of other physicians Hospitalizations, surgeries, and ER visits in previous 12 months Vitals Screenings to include cognitive, depression, and falls Referrals and appointments  In addition, I have reviewed and discussed with patient certain preventive protocols, quality metrics, and best practice recommendations. A written personalized care plan for preventive services as well as general preventive health recommendations were provided to patient.     March Rummage, LPN   [birth year]   Nurse Notes: none

## 2021-04-04 NOTE — Patient Instructions (Signed)
Beth Cantu , Thank you for taking time to come for your Medicare Wellness Visit. I appreciate your ongoing commitment to your health goals. Please review the following plan we discussed and let me know if I can assist you in the future.   Screening recommendations/referrals: Colonoscopy: no longer required  Mammogram: no longer required  Bone Density: declined  Recommended yearly ophthalmology/optometry visit for glaucoma screening and checkup Recommended yearly dental visit for hygiene and checkup  Vaccinations: Influenza vaccine: completed  Pneumococcal vaccine: completed series  Tdap vaccine: due with injury  Shingles vaccine: declined     Advanced directives: will provide copies   Conditions/risks identified: none   Next appointment: none    Preventive Care 65 Years and Older, Female Preventive care refers to lifestyle choices and visits with your health care provider that can promote health and wellness. What does preventive care include? A yearly physical exam. This is also called an annual well check. Dental exams once or twice a year. Routine eye exams. Ask your health care provider how often you should have your eyes checked. Personal lifestyle choices, including: Daily care of your teeth and gums. Regular physical activity. Eating a healthy diet. Avoiding tobacco and drug use. Limiting alcohol use. Practicing safe sex. Taking low-dose aspirin every day. Taking vitamin and mineral supplements as recommended by your health care provider. What happens during an annual well check? The services and screenings done by your health care provider during your annual well check will depend on your age, overall health, lifestyle risk factors, and family history of disease. Counseling  Your health care provider may ask you questions about your: Alcohol use. Tobacco use. Drug use. Emotional well-being. Home and relationship well-being. Sexual activity. Eating  habits. History of falls. Memory and ability to understand (cognition). Work and work Astronomer. Reproductive health. Screening  You may have the following tests or measurements: Height, weight, and BMI. Blood pressure. Lipid and cholesterol levels. These may be checked every 5 years, or more frequently if you are over 21 years old. Skin check. Lung cancer screening. You may have this screening every year starting at age 86 if you have a 30-pack-year history of smoking and currently smoke or have quit within the past 15 years. Fecal occult blood test (FOBT) of the stool. You may have this test every year starting at age 52. Flexible sigmoidoscopy or colonoscopy. You may have a sigmoidoscopy every 5 years or a colonoscopy every 10 years starting at age 15. Hepatitis C blood test. Hepatitis B blood test. Sexually transmitted disease (STD) testing. Diabetes screening. This is done by checking your blood sugar (glucose) after you have not eaten for a while (fasting). You may have this done every 1-3 years. Bone density scan. This is done to screen for osteoporosis. You may have this done starting at age 42. Mammogram. This may be done every 1-2 years. Talk to your health care provider about how often you should have regular mammograms. Talk with your health care provider about your test results, treatment options, and if necessary, the need for more tests. Vaccines  Your health care provider may recommend certain vaccines, such as: Influenza vaccine. This is recommended every year. Tetanus, diphtheria, and acellular pertussis (Tdap, Td) vaccine. You may need a Td booster every 10 years. Zoster vaccine. You may need this after age 81. Pneumococcal 13-valent conjugate (PCV13) vaccine. One dose is recommended after age 86. Pneumococcal polysaccharide (PPSV23) vaccine. One dose is recommended after age 21. Talk to your health  care provider about which screenings and vaccines you need and how  often you need them. This information is not intended to replace advice given to you by your health care provider. Make sure you discuss any questions you have with your health care provider. Document Released: 06/21/2015 Document Revised: 02/12/2016 Document Reviewed: 03/26/2015 Elsevier Interactive Patient Education  2017 [place] Prevention in the Home Falls can cause injuries. They can happen to people of all ages. There are many things you can do to make your home safe and to help prevent falls. What can I do on the outside of my home? Regularly fix the edges of walkways and driveways and fix any cracks. Remove anything that might make you trip as you walk through a door, such as a raised step or threshold. Trim any bushes or trees on the path to your home. Use bright outdoor lighting. Clear any walking paths of anything that might make someone trip, such as rocks or tools. Regularly check to see if handrails are loose or broken. Make sure that both sides of any steps have handrails. Any raised decks and porches should have guardrails on the edges. Have any leaves, snow, or ice cleared regularly. Use sand or salt on walking paths during winter. Clean up any spills in your garage right away. This includes oil or grease spills. What can I do in the bathroom? Use night lights. Install grab bars by the toilet and in the tub and shower. Do not use towel bars as grab bars. Use non-skid mats or decals in the tub or shower. If you need to sit down in the shower, use a plastic, non-slip stool. Keep the floor dry. Clean up any water that spills on the floor as soon as it happens. Remove soap buildup in the tub or shower regularly. Attach bath mats securely with double-sided non-slip rug tape. Do not have throw rugs and other things on the floor that can make you trip. What can I do in the bedroom? Use night lights. Make sure that you have a light by your bed that is easy to  reach. Do not use any sheets or blankets that are too big for your bed. They should not hang down onto the floor. Have a firm chair that has side arms. You can use this for support while you get dressed. Do not have throw rugs and other things on the floor that can make you trip. What can I do in the kitchen? Clean up any spills right away. Avoid walking on wet floors. Keep items that you use a lot in easy-to-reach places. If you need to reach something above you, use a strong step stool that has a grab bar. Keep electrical cords out of the way. Do not use floor polish or wax that makes floors slippery. If you must use wax, use non-skid floor wax. Do not have throw rugs and other things on the floor that can make you trip. What can I do with my stairs? Do not leave any items on the stairs. Make sure that there are handrails on both sides of the stairs and use them. Fix handrails that are broken or loose. Make sure that handrails are as long as the stairways. Check any carpeting to make sure that it is firmly attached to the stairs. Fix any carpet that is loose or worn. Avoid having throw rugs at the top or bottom of the stairs. If you do have throw rugs, attach them to  the floor with carpet tape. Make sure that you have a light switch at the top of the stairs and the bottom of the stairs. If you do not have them, ask someone to add them for you. What else can I do to help prevent falls? Wear shoes that: Do not have high heels. Have rubber bottoms. Are comfortable and fit you well. Are closed at the toe. Do not wear sandals. If you use a stepladder: Make sure that it is fully opened. Do not climb a closed stepladder. Make sure that both sides of the stepladder are locked into place. Ask someone to hold it for you, if possible. Clearly mark and make sure that you can see: Any grab bars or handrails. First and last steps. Where the edge of each step is. Use tools that help you move  around (mobility aids) if they are needed. These include: Canes. Walkers. Scooters. Crutches. Turn on the lights when you go into a dark area. Replace any light bulbs as soon as they burn out. Set up your furniture so you have a clear path. Avoid moving your furniture around. If any of your floors are uneven, fix them. If there are any pets around you, be aware of where they are. Review your medicines with your doctor. Some medicines can make you feel dizzy. This can increase your chance of falling. Ask your doctor what other things that you can do to help prevent falls. This information is not intended to replace advice given to you by your health care provider. Make sure you discuss any questions you have with your health care provider. Document Released: 03/21/2009 Document Revised: 10/31/2015 Document Reviewed: 06/29/2014 Elsevier Interactive Patient Education  2017 [employer].

## 2021-10-31 ENCOUNTER — Encounter: Payer: Medicare HMO | Admitting: Family Medicine

## 2021-11-05 ENCOUNTER — Encounter: Payer: Medicare HMO | Admitting: Family Medicine

## 2021-12-01 ENCOUNTER — Encounter: Payer: Medicare HMO | Admitting: Family Medicine

## 2021-12-02 NOTE — Progress Notes (Signed)
HPI: Beth Cantu is a 88 y.o. female, who is here today with her daughter for her routine physical.  Last CPE: 10/30/20  She is not exercising regularly and in general she follows a healthful diet. She lives in an independent living facility, she is not longer driving. Eating meals in her room, does not go to the lounge afraid of COVID 19 contact. Skips breakfast, eats around 11 am and later at 5-6 pm.She eats salads and fruit.  According to her daughter,she has decreased physical activity in general, sleeps during the day right after meals and stays up for hours at night.  Chronic medical problems: Vein disease,TIA, eczema,and HLD among some.  Immunization History  Administered Date(s) Administered   Influenza, High Dose Seasonal PF 04/14/2016   PFIZER(Purple Top)SARS-COV-2 Vaccination 06/28/2019, 07/16/2019, 04/06/2020   Pneumococcal Conjugate-13 02/05/2015   Pneumococcal Polysaccharide-23 02/25/2009   Health Maintenance  Topic Date Due   COVID-19 Vaccine (4 - Pfizer series) 06/12/2022 (Originally 06/01/2020)   Zoster Vaccines- Shingrix (1 of 2) 06/12/2022 (Originally 02/19/1975)   INFLUENZA VACCINE  01/06/2022   Pneumonia Vaccine 55+ Years old  Completed   HPV VACCINES  Aged Out   DEXA SCAN  Discontinued   TETANUS/TDAP  Discontinued   Hx of TIA years ago x 1, she stopped Plavix. She is not on statin medication. Today I noted a heart murmur and extra beats. I have heard a soft heart murmur in the past. She denies any CP,SOB,palpitations,or dizziness. Very concerned when mentioned findings today, she is requesting appt with cardiologist.  Lab Results  Component Value Date   CHOL 179 10/30/2020   HDL 62.50 10/30/2020   LDLCALC 104 (H) 10/30/2020   TRIG 66.0 10/30/2020   CHOLHDL 3 10/30/2020   Lower back pain, chronic, gradually getting worse. She wears a brace that helps some. Refused PT. Her daughter thinks posture is aggravating problem. She uses a cane. No  falls in the past year.  Trouble sleeping , wakes up a few times and difficulty falling asleep. Nocturia 3-4 times at night and sometimes q 2 hours. Problem has been going on for at least a couple of years. Negative for dysuria,gross hematuria,or foam in urine. She wears depends.  Vein disease with LLE edema. She is not wearing compression stockings. She has seen vascular/vein vein specialist. Edema is worse at the end of the day after prolonged standing. Negative for orthopnea or PND.  Intertrigo: Nystatin cream has helped. Macular erythema intermittent under breast. Exacerbated by heat and sweating. Needs a refill on Nystatin.  Review of Systems  Constitutional:  Positive for activity change and fatigue. Negative for appetite change and fever.  HENT:  Negative for mouth sores, nosebleeds and sore throat.   Eyes:  Negative for redness and visual disturbance.  Respiratory:  Negative for cough and wheezing.   Gastrointestinal:  Negative for abdominal pain, nausea and vomiting.       Negative for changes in bowel habits.  Endocrine: Negative for cold intolerance, heat intolerance, polydipsia, polyphagia and polyuria.  Genitourinary:  Negative for decreased urine volume and dysuria.  Musculoskeletal:  Positive for arthralgias, back pain and gait problem.  Skin:  Negative for rash.  Neurological:  Negative for syncope, weakness and headaches.  Psychiatric/Behavioral:  Positive for sleep disturbance. Negative for confusion. The patient is nervous/anxious.   All other systems reviewed and are negative.  Current Outpatient Medications on File Prior to Visit  Medication Sig Dispense Refill   Ergocalciferol (VITAMIN D2) 400 units  TABS Take by mouth.     Multiple Vitamins-Minerals (PRESERVISION AREDS 2 PO) Take by mouth.     nystatin cream (MYCOSTATIN) Apply 1 application topically 2 (two) times daily. 60 g 4   No current facility-administered medications on file prior to visit.   Past  Medical History:  Diagnosis Date   Back pain    Hyperlipidemia    Varicose veins    History reviewed. No pertinent surgical history.  Allergies  Allergen Reactions   Aspirin     Other reaction(s): Other (See Comments) Other Reaction: GI UPSET   Erythromycin     Other reaction(s): Other (See Comments) Makes her sick    Family History  Problem Relation Age of Onset   Cancer Father        bladder    Social History   Socioeconomic History   Marital status: Widowed    Spouse name: Not on file   Number of children: Not on file   Years of education: Not on file   Highest education level: Not on file  Occupational History   Not on file  Tobacco Use   Smoking status: Never   Smokeless tobacco: Never  Substance and Sexual Activity   Alcohol use: No    Alcohol/week: 0.0 standard drinks of alcohol   Drug use: No   Sexual activity: Not Currently  Other Topics Concern   Not on file  Social History Narrative   Not on file   Social Determinants of Health   Financial Resource Strain: Low Risk  (04/04/2021)   Overall Financial Resource Strain (CARDIA)    Difficulty of Paying Living Expenses: Not hard at all  Food Insecurity: No Food Insecurity (04/04/2021)   Hunger Vital Sign    Worried About Running Out of Food in the Last Year: Never true    Ran Out of Food in the Last Year: Never true  Transportation Needs: No Transportation Needs (04/04/2021)   PRAPARE - Administrator, Civil Service (Medical): No    Lack of Transportation (Non-Medical): No  Physical Activity: Insufficiently Active (04/04/2021)   Exercise Vital Sign    Days of Exercise per Week: 3 days    Minutes of Exercise per Session: 20 min  Stress: No Stress Concern Present (04/04/2021)   [employer] of Occupational Health - Occupational Stress Questionnaire    Feeling of Stress : Not at all  Social Connections: Moderately Isolated (04/04/2021)   Social Connection and Isolation Panel  [NHANES]    Frequency of Communication with Friends and Family: Twice a week    Frequency of Social Gatherings with Friends and Family: Twice a week    Attends Religious Services: 1 to 4 times per year    Active Member of [employer] or Organizations: No    Attends Banker Meetings: Never    Marital Status: Widowed   Vitals:   12/03/21 1020  BP: 128/80  Pulse: 93  Resp: 16  SpO2: 97%  Body mass index is 28.86 kg/m.  Wt Readings from Last 3 Encounters:  12/03/21 168 lb 2 oz (76.3 kg)  10/30/20 167 lb (75.8 kg)  10/31/19 177 lb 4 oz (80.4 kg)   Physical Exam Vitals and nursing note reviewed.  Constitutional:      General: She is not in acute distress.    Appearance: She is well-developed.  HENT:     Head: Normocephalic and atraumatic.     Mouth/Throat:     Mouth: Mucous membranes are moist.  Pharynx: Oropharynx is clear.  Eyes:     Conjunctiva/sclera: Conjunctivae normal.  Cardiovascular:     Rate and Rhythm: Normal rate and regular rhythm. Occasional Extrasystoles are present.    Heart sounds: Murmur (Soft SEM RUSB and LUSB) heard.     Comments: PT pulses present. Pulmonary:     Effort: Pulmonary effort is normal. No respiratory distress.     Breath sounds: Normal breath sounds.  Abdominal:     Palpations: Abdomen is soft. There is no hepatomegaly or mass.     Tenderness: There is no abdominal tenderness.  Musculoskeletal:     Right lower leg: Edema (Trace pitting.) present.     Left lower leg: 2+ Edema present.  Lymphadenopathy:     Cervical: No cervical adenopathy.  Skin:    General: Skin is warm.     Findings: No erythema or rash.  Neurological:     General: No focal deficit present.     Mental Status: She is alert and oriented to person, place, and time.     Cranial Nerves: No cranial nerve deficit.     Comments: Mildly unstable , antalgic gait associated with a cane.  Psychiatric:        Mood and Affect: Mood is anxious.     Comments: Well  groomed, good eye contact.   ASSESSMENT AND PLAN:  Beth Cantu was here today annual physical examination.  Orders Placed This Encounter  Procedures   Urinalysis with Culture Reflex   Lipid panel   Basic metabolic panel   Ambulatory referral to Cardiology   Lab Results  Component Value Date   CREATININE 0.56 12/03/2021   BUN 21 12/03/2021   NA 136 12/03/2021   K 4.0 12/03/2021   CL 104 12/03/2021   CO2 28 12/03/2021   Lab Results  Component Value Date   CHOL 183 12/03/2021   HDL 76.40 12/03/2021   LDLCALC 95 12/03/2021   TRIG 59.0 12/03/2021   CHOLHDL 2 12/03/2021   Routine general medical examination at a health care facility We discussed the importance of regular low impact physical activity and healthy diet. Preventive guidelines reviewed and goals of care. Vaccination up to day. We discussed the adverse effects of social isolation, I would like more socialization.She can go to the lounge to eat her meals, she is afraid of getting sick. Next CPE in a year.  Pure hypercholesterolemia Mild. She is not on pharmacologic treatment. Continue low fat diet, further recommendations according to lab results.  Heart murmur It is mild with occasional irregular HR noted today. Asymptomatic. She would like to see cardiologist, referral placed.  Urine frequency Chronic. No other associated symptoms. ? Overactive bladder. Because of risk of side effects I do not recommend pharmacologic treatment, she agrees.Recommend trying to limit fluid intake later during the day. UA ordered today.  Sleep disorder Stressed the importance of adequate sleep hygiene. Try not to take long naps during the day. OTC Melatonin 5 mg 2 hours before bedtime may help.  Chronic bilateral back pain, unspecified back location Tylenol 500 mg 3-4 times per day. We discussed other options for pain management and side effects, she is not interested in trying stronger medications. A better posture  may help as well as PT, which she is not interested in doing for now. Fall precautions discussed.  Intertrigo Problem is well controlled. Continue Nystatin cream bid prn. Keep area dry and clear with soap and water.  Return in 1 year (on 12/04/2022) for  CPE.  Ozie Lupe G. [place], MD  [facility] [place] office.

## 2021-12-03 ENCOUNTER — Ambulatory Visit (INDEPENDENT_AMBULATORY_CARE_PROVIDER_SITE_OTHER): Payer: Medicare HMO | Admitting: Family Medicine

## 2021-12-03 ENCOUNTER — Encounter: Payer: Self-pay | Admitting: Family Medicine

## 2021-12-03 VITALS — BP 128/80 | HR 93 | Resp 16 | Ht 64.0 in | Wt 168.1 lb

## 2021-12-03 DIAGNOSIS — E7800 Pure hypercholesterolemia, unspecified: Secondary | ICD-10-CM

## 2021-12-03 DIAGNOSIS — R350 Frequency of micturition: Secondary | ICD-10-CM | POA: Diagnosis not present

## 2021-12-03 DIAGNOSIS — Z0000 Encounter for general adult medical examination without abnormal findings: Secondary | ICD-10-CM

## 2021-12-03 DIAGNOSIS — G479 Sleep disorder, unspecified: Secondary | ICD-10-CM

## 2021-12-03 DIAGNOSIS — R011 Cardiac murmur, unspecified: Secondary | ICD-10-CM | POA: Diagnosis not present

## 2021-12-03 DIAGNOSIS — L304 Erythema intertrigo: Secondary | ICD-10-CM | POA: Diagnosis not present

## 2021-12-03 DIAGNOSIS — M549 Dorsalgia, unspecified: Secondary | ICD-10-CM

## 2021-12-03 DIAGNOSIS — G8929 Other chronic pain: Secondary | ICD-10-CM

## 2021-12-03 LAB — BASIC METABOLIC PANEL
BUN: 21 mg/dL (ref 6–23)
CO2: 28 mEq/L (ref 19–32)
Calcium: 9.7 mg/dL (ref 8.4–10.5)
Chloride: 104 mEq/L (ref 96–112)
Creatinine, Ser: 0.56 mg/dL (ref 0.40–1.20)
GFR: 77.92 mL/min (ref >60.00)
Glucose, Bld: 102 mg/dL — ABNORMAL HIGH (ref 70–99)
Potassium: 4 mEq/L (ref 3.5–5.1)
Sodium: 136 mEq/L (ref 135–145)

## 2021-12-03 LAB — LIPID PANEL
Cholesterol: 183 mg/dL (ref 0–200)
HDL: 76.4 mg/dL (ref >39.00)
LDL Cholesterol: 95 mg/dL (ref 0–99)
NonHDL: 106.31
Total CHOL/HDL Ratio: 2
Triglycerides: 59 mg/dL (ref 0.0–149.0)
VLDL: 11.8 mg/dL (ref 0.0–40.0)

## 2021-12-03 NOTE — Patient Instructions (Addendum)
A few things to remember from today's visit:  Routine general medical examination at a health care facility  Pure hypercholesterolemia - Plan: Lipid panel, Basic metabolic panel  Heart murmur - Plan: Ambulatory referral to Cardiology  Urine frequency - Plan: Urinalysis with Culture Reflex  Sleep disorder  Do not use My Chart to request refills or for acute issues that need immediate attention.   Please be sure medication list is accurate. If a new problem present, please set up appointment sooner than planned today.  Melatonin 5 mg 2 hours before bedtime and empty stomach. Try no to drink fluid 3-4 hours before bedtime.  Preventive Care 7 Years and Older, Female Preventive care refers to lifestyle choices and visits with your health care provider that can promote health and wellness. Preventive care visits are also called wellness exams. What can I expect for my preventive care visit? Counseling Your health care provider may ask you questions about your: Medical history, including: Past medical problems. Family medical history. Pregnancy and menstrual history. History of falls. Current health, including: Memory and ability to understand (cognition). Emotional well-being. Home life and relationship well-being. Sexual activity and sexual health. Lifestyle, including: Alcohol, nicotine or tobacco, and drug use. Access to firearms. Diet, exercise, and sleep habits. Work and work Statistician. Sunscreen use. Safety issues such as seatbelt and bike helmet use. Physical exam Your health care provider will check your: Height and weight. These may be used to calculate your BMI (body mass index). BMI is a measurement that tells if you are at a healthy weight. Waist circumference. This measures the distance around your waistline. This measurement also tells if you are at a healthy weight and may help predict your risk of certain diseases, such as type 2 diabetes and high blood  pressure. Heart rate and blood pressure. Body temperature. Skin for abnormal spots. What immunizations do I need?  Vaccines are usually given at various ages, according to a schedule. Your health care provider will recommend vaccines for you based on your age, medical history, and lifestyle or other factors, such as travel or where you work. What tests do I need? Screening Your health care provider may recommend screening tests for certain conditions. This may include: Lipid and cholesterol levels. Hepatitis C test. Hepatitis B test. HIV (human immunodeficiency virus) test. STI (sexually transmitted infection) testing, if you are at risk. Lung cancer screening. Colorectal cancer screening. Diabetes screening. This is done by checking your blood sugar (glucose) after you have not eaten for a while (fasting). Mammogram. Talk with your health care provider about how often you should have regular mammograms. BRCA-related cancer screening. This may be done if you have a family history of breast, ovarian, tubal, or peritoneal cancers. Bone density scan. This is done to screen for osteoporosis. Talk with your health care provider about your test results, treatment options, and if necessary, the need for more tests. Follow these instructions at home: Eating and drinking  Eat a diet that includes fresh fruits and vegetables, whole grains, lean protein, and low-fat dairy products. Limit your intake of foods with high amounts of sugar, saturated fats, and salt. Take vitamin and mineral supplements as recommended by your health care provider. Do not drink alcohol if your health care provider tells you not to drink. If you drink alcohol: Limit how much you have to 0-1 drink a day. Know how much alcohol is in your drink. In the [place] one drink equals one 12 oz bottle of beer (355 mL),  one 5 oz glass of wine (148 mL), or one 1 oz glass of hard liquor (44 mL). Lifestyle Brush your teeth every  morning and night with fluoride toothpaste. Floss one time each day. Exercise for at least 30 minutes 5 or more days each week. Do not use any products that contain nicotine or tobacco. These products include cigarettes, chewing tobacco, and vaping devices, such as e-cigarettes. If you need help quitting, ask your health care provider. Do not use drugs. If you are sexually active, practice safe sex. Use a condom or other form of protection in order to prevent STIs. Take aspirin only as told by your health care provider. Make sure that you understand how much to take and what form to take. Work with your health care provider to find out whether it is safe and beneficial for you to take aspirin daily. Ask your health care provider if you need to take a cholesterol-lowering medicine (statin). Find healthy ways to manage stress, such as: Meditation, yoga, or listening to music. Journaling. Talking to a trusted person. Spending time with friends and family. Minimize exposure to UV radiation to reduce your risk of skin cancer. Safety Always wear your seat belt while driving or riding in a vehicle. Do not drive: If you have been drinking alcohol. Do not ride with someone who has been drinking. When you are tired or distracted. While texting. If you have been using any mind-altering substances or drugs. Wear a helmet and other protective equipment during sports activities. If you have firearms in your house, make sure you follow all gun safety procedures. What's next? Visit your health care provider once a year for an annual wellness visit. Ask your health care provider how often you should have your eyes and teeth checked. Stay up to date on all vaccines. This information is not intended to replace advice given to you by your health care provider. Make sure you discuss any questions you have with your health care provider. Document Revised: 11/20/2020 Document Reviewed: 11/20/2020 Elsevier  Patient Education  [place].

## 2021-12-07 ENCOUNTER — Encounter: Payer: Self-pay | Admitting: Family Medicine

## 2021-12-07 MED ORDER — NYSTATIN 100000 UNIT/GM EX CREA: 1.0000 | TOPICAL_CREAM | Freq: Two times a day (BID) | CUTANEOUS | 4 refills | Status: DC | PRN

## 2021-12-12 LAB — URINALYSIS W MICROSCOPIC + REFLEX CULTURE

## 2021-12-22 ENCOUNTER — Observation Stay (HOSPITAL_COMMUNITY): Payer: Medicare HMO | Admitting: Certified Registered"

## 2021-12-22 ENCOUNTER — Other Ambulatory Visit: Payer: Self-pay

## 2021-12-22 ENCOUNTER — Observation Stay (HOSPITAL_BASED_OUTPATIENT_CLINIC_OR_DEPARTMENT_OTHER): Payer: Medicare HMO

## 2021-12-22 ENCOUNTER — Observation Stay (HOSPITAL_BASED_OUTPATIENT_CLINIC_OR_DEPARTMENT_OTHER): Payer: Medicare HMO | Admitting: Certified Registered"

## 2021-12-22 ENCOUNTER — Encounter (HOSPITAL_COMMUNITY)
Admission: EM | Disposition: A | Discharge status: Home / Self Care | Payer: Self-pay | Source: Home / Self Care | Attending: Emergency Medicine

## 2021-12-22 ENCOUNTER — Encounter (HOSPITAL_COMMUNITY): Payer: Self-pay | Admitting: Internal Medicine

## 2021-12-22 ENCOUNTER — Emergency Department (HOSPITAL_COMMUNITY): Payer: Medicare HMO

## 2021-12-22 ENCOUNTER — Observation Stay (HOSPITAL_COMMUNITY)
Admission: EM | Admit: 2021-12-22 | Discharge: 2021-12-23 | Disposition: A | Discharge status: Home / Self Care | Payer: Medicare HMO | Attending: Internal Medicine | Admitting: Internal Medicine

## 2021-12-22 DIAGNOSIS — E441 Mild protein-calorie malnutrition: Secondary | ICD-10-CM | POA: Diagnosis not present

## 2021-12-22 DIAGNOSIS — K5750 Diverticulosis of both small and large intestine without perforation or abscess without bleeding: Secondary | ICD-10-CM | POA: Insufficient documentation

## 2021-12-22 DIAGNOSIS — K5710 Diverticulosis of small intestine without perforation or abscess without bleeding: Secondary | ICD-10-CM | POA: Diagnosis not present

## 2021-12-22 DIAGNOSIS — E46 Unspecified protein-calorie malnutrition: Secondary | ICD-10-CM | POA: Diagnosis not present

## 2021-12-22 DIAGNOSIS — K2970 Gastritis, unspecified, without bleeding: Secondary | ICD-10-CM

## 2021-12-22 DIAGNOSIS — R739 Hyperglycemia, unspecified: Secondary | ICD-10-CM | POA: Diagnosis not present

## 2021-12-22 DIAGNOSIS — Z79899 Other long term (current) drug therapy: Secondary | ICD-10-CM | POA: Insufficient documentation

## 2021-12-22 DIAGNOSIS — K8020 Calculus of gallbladder without cholecystitis without obstruction: Secondary | ICD-10-CM | POA: Diagnosis not present

## 2021-12-22 DIAGNOSIS — R933 Abnormal findings on diagnostic imaging of other parts of digestive tract: Secondary | ICD-10-CM | POA: Insufficient documentation

## 2021-12-22 DIAGNOSIS — K209 Esophagitis, unspecified without bleeding: Secondary | ICD-10-CM

## 2021-12-22 DIAGNOSIS — M7989 Other specified soft tissue disorders: Secondary | ICD-10-CM | POA: Diagnosis not present

## 2021-12-22 DIAGNOSIS — Z8673 Personal history of transient ischemic attack (TIA), and cerebral infarction without residual deficits: Secondary | ICD-10-CM | POA: Diagnosis not present

## 2021-12-22 DIAGNOSIS — K920 Hematemesis: Principal | ICD-10-CM | POA: Diagnosis present

## 2021-12-22 DIAGNOSIS — K449 Diaphragmatic hernia without obstruction or gangrene: Secondary | ICD-10-CM | POA: Insufficient documentation

## 2021-12-22 DIAGNOSIS — E7800 Pure hypercholesterolemia, unspecified: Secondary | ICD-10-CM | POA: Diagnosis not present

## 2021-12-22 HISTORY — PX: ESOPHAGOGASTRODUODENOSCOPY (EGD) WITH PROPOFOL: SHX5813

## 2021-12-22 HISTORY — PX: BIOPSY: SHX5522

## 2021-12-22 LAB — COMPREHENSIVE METABOLIC PANEL
ALT: 15 U/L (ref 0–44)
AST: 21 U/L (ref 15–41)
Albumin: 3.1 g/dL — ABNORMAL LOW (ref 3.5–5.0)
Alkaline Phosphatase: 51 U/L (ref 38–126)
Anion gap: 9 (ref 5–15)
BUN: 26 mg/dL — ABNORMAL HIGH (ref 8–23)
CO2: 23 mmol/L (ref 22–32)
Calcium: 8.5 mg/dL — ABNORMAL LOW (ref 8.9–10.3)
Chloride: 110 mmol/L (ref 98–111)
Creatinine, Ser: 0.61 mg/dL (ref 0.44–1.00)
GFR, Estimated: >60 mL/min (ref >60)
Glucose, Bld: 244 mg/dL — ABNORMAL HIGH (ref 70–99)
Potassium: 3.7 mmol/L (ref 3.5–5.1)
Sodium: 142 mmol/L (ref 135–145)
Total Bilirubin: 0.6 mg/dL (ref 0.3–1.2)
Total Protein: 5.6 g/dL — ABNORMAL LOW (ref 6.5–8.1)

## 2021-12-22 LAB — CBC WITH DIFFERENTIAL/PLATELET
Abs Immature Granulocytes: 0.03 10*3/uL (ref 0.00–0.07)
Basophils Absolute: 0 10*3/uL (ref 0.0–0.1)
Basophils Relative: 0 %
Eosinophils Absolute: 0 10*3/uL (ref 0.0–0.5)
Eosinophils Relative: 0 %
HCT: 40.3 % (ref 36.0–46.0)
Hemoglobin: 13.4 g/dL (ref 12.0–15.0)
Immature Granulocytes: 0 %
Lymphocytes Relative: 5 %
Lymphs Abs: 0.6 10*3/uL — ABNORMAL LOW (ref 0.7–4.0)
MCH: 30.5 pg (ref 26.0–34.0)
MCHC: 33.3 g/dL (ref 30.0–36.0)
MCV: 91.6 fL (ref 80.0–100.0)
Monocytes Absolute: 0.4 10*3/uL (ref 0.1–1.0)
Monocytes Relative: 4 %
Neutro Abs: 10.1 10*3/uL — ABNORMAL HIGH (ref 1.7–7.7)
Neutrophils Relative %: 91 %
Platelets: 203 10*3/uL (ref 150–400)
RBC: 4.4 MIL/uL (ref 3.87–5.11)
RDW: 14.1 % (ref 11.5–15.5)
WBC: 11.2 10*3/uL — ABNORMAL HIGH (ref 4.0–10.5)
nRBC: 0 % (ref 0.0–0.2)

## 2021-12-22 LAB — LIPASE, BLOOD: Lipase: 88 U/L — ABNORMAL HIGH (ref 11–51)

## 2021-12-22 LAB — TYPE AND SCREEN
ABO/RH(D): A POS
Antibody Screen: NEGATIVE

## 2021-12-22 LAB — HEMOGLOBIN AND HEMATOCRIT, BLOOD
HCT: 39.2 % (ref 36.0–46.0)
HCT: 41.8 % (ref 36.0–46.0)
Hemoglobin: 12.5 g/dL (ref 12.0–15.0)
Hemoglobin: 13.4 g/dL (ref 12.0–15.0)

## 2021-12-22 SURGERY — ESOPHAGOGASTRODUODENOSCOPY (EGD) WITH PROPOFOL
Anesthesia: Monitor Anesthesia Care

## 2021-12-22 MED ORDER — LACTATED RINGERS IV BOLUS
1000.0000 mL | Freq: Once | INTRAVENOUS | Status: AC
  Administered 2021-12-22: 1000 mL via INTRAVENOUS

## 2021-12-22 MED ORDER — PANTOPRAZOLE INFUSION (NEW) - SIMPLE MED
8.0000 mg/h | INTRAVENOUS | Status: DC
  Administered 2021-12-22: 8 mg/h via INTRAVENOUS
  Filled 2021-12-22: qty 80

## 2021-12-22 MED ORDER — ONDANSETRON HCL 4 MG PO TABS: 4.0000 mg | ORAL_TABLET | Freq: Four times a day (QID) | ORAL | Status: DC | PRN

## 2021-12-22 MED ORDER — PANTOPRAZOLE SODIUM 40 MG IV SOLR
40.0000 mg | Freq: Once | INTRAVENOUS | Status: AC
  Administered 2021-12-22: 40 mg via INTRAVENOUS
  Filled 2021-12-22: qty 10

## 2021-12-22 MED ORDER — PANTOPRAZOLE SODIUM 40 MG IV SOLR: 40.0000 mg | Freq: Two times a day (BID) | INTRAVENOUS | Status: DC

## 2021-12-22 MED ORDER — ACETAMINOPHEN 325 MG PO TABS: 650.0000 mg | ORAL_TABLET | Freq: Four times a day (QID) | ORAL | Status: DC | PRN

## 2021-12-22 MED ORDER — PROPOFOL 10 MG/ML IV BOLUS
INTRAVENOUS | Status: DC | PRN
  Administered 2021-12-22: 40 mg via INTRAVENOUS
  Administered 2021-12-22: 20 mg via INTRAVENOUS

## 2021-12-22 MED ORDER — LIDOCAINE 2% (20 MG/ML) 5 ML SYRINGE
INTRAMUSCULAR | Status: DC | PRN
  Administered 2021-12-22: 40 mg via INTRAVENOUS

## 2021-12-22 MED ORDER — ACETAMINOPHEN 650 MG RE SUPP: 650.0000 mg | Freq: Four times a day (QID) | RECTAL | Status: DC | PRN

## 2021-12-22 MED ORDER — SODIUM CHLORIDE (PF) 0.9 % IJ SOLN
INTRAMUSCULAR | Status: AC
  Filled 2021-12-22: qty 50

## 2021-12-22 MED ORDER — PANTOPRAZOLE SODIUM 40 MG PO TBEC
40.0000 mg | DELAYED_RELEASE_TABLET | Freq: Two times a day (BID) | ORAL | Status: DC
  Administered 2021-12-22 – 2021-12-23 (×2): 40 mg via ORAL
  Filled 2021-12-22 (×2): qty 1

## 2021-12-22 MED ORDER — ONDANSETRON HCL 4 MG/2ML IJ SOLN: 4.0000 mg | Freq: Four times a day (QID) | INTRAMUSCULAR | Status: DC | PRN

## 2021-12-22 MED ORDER — PROPOFOL 500 MG/50ML IV EMUL
INTRAVENOUS | Status: DC | PRN
  Administered 2021-12-22: 50 ug/kg/min via INTRAVENOUS

## 2021-12-22 MED ORDER — LACTATED RINGERS IV SOLN: INTRAVENOUS | Status: DC

## 2021-12-22 MED ORDER — IOHEXOL 300 MG/ML  SOLN
100.0000 mL | Freq: Once | INTRAMUSCULAR | Status: AC | PRN
  Administered 2021-12-22: 100 mL via INTRAVENOUS

## 2021-12-22 MED ORDER — ONDANSETRON HCL 4 MG/2ML IJ SOLN
4.0000 mg | Freq: Once | INTRAMUSCULAR | Status: AC
Start: 2021-12-22 — End: 2021-12-22
  Administered 2021-12-22: 4 mg via INTRAVENOUS
  Filled 2021-12-22: qty 2

## 2021-12-22 MED ORDER — POTASSIUM CHLORIDE IN NACL 20-0.9 MEQ/L-% IV SOLN
INTRAVENOUS | Status: DC
Start: 2021-12-22 — End: 2021-12-23
  Filled 2021-12-22 (×4): qty 1000

## 2021-12-22 SURGICAL SUPPLY — 15 items

## 2021-12-22 NOTE — Op Note (Signed)
[facility] Patient Name: Beth Cantu Procedure Date: 12/22/2021 MRN: [id number] Attending MD: Justice Britain , MD Date of Birth: Jul 19, 1924 CSN: [id number] Age: 88 Admit Type: Outpatient Procedure:                Upper GI endoscopy Indications:              Heartburn, Abnormal CT of the GI tract, Nausea with                            vomiting Providers:                Justice Britain, MD, Truddie Coco, RN, William Dalton, Technician Referring MD:             [facility] Medicines:                Monitored Anesthesia Care Complications:            No immediate complications. Estimated Blood Loss:     Estimated blood loss was minimal. Procedure:                Pre-Anesthesia Assessment:                           - Prior to the procedure, a History and Physical                            was performed, and patient medications and                            allergies were reviewed. The patient's tolerance of                            previous anesthesia was also reviewed. The risks                            and benefits of the procedure and the sedation                            options and risks were discussed with the patient.                            All questions were answered, and informed consent                            was obtained. Prior Anticoagulants: The patient has                            taken no previous anticoagulant or antiplatelet                            agents. ASA Grade Assessment: III - A patient with  severe systemic disease. After reviewing the risks                            and benefits, the patient was deemed in                            satisfactory condition to undergo the procedure.                           After obtaining informed consent, the endoscope was                            passed under direct vision. Throughout the                            procedure,  the patient's blood pressure, pulse, and                            oxygen saturations were monitored continuously. The                            [id number]-H190 ([id number] Olympus endoscope was introduced                            through the mouth, and advanced to the second part                            of duodenum. The upper GI endoscopy was                            accomplished without difficulty. The patient                            tolerated the procedure. Scope In: Scope Out: Findings:      No gross lesions were noted in the proximal esophagus and in the mid       esophagus.      LA Grade B (one or more mucosal breaks greater than 5 mm, not extending       between the tops of two mucosal folds) esophagitis with no bleeding was       found in the distal esophagus.      A large, 10 cm, hiatal hernia was found. The proximal extent of the       gastric folds (end of tubular esophagus) was 33 cm from the incisors.       The hiatal narrowing was 43 cm from the incisors. The Z-line was 33 cm       from the incisors.      Patchy moderate inflammation characterized by erosions, friability and       granularity was found in the entire examined stomach. Biopsies were       taken with a cold forceps for histology and Helicobacter pylori testing.      No gross mucosal lesions were noted in the duodenal bulb, in the first       portion of the duodenum and in the second portion of the duodenum.  A large non-bleeding diverticulum was found in the area of the papilla.      The major papilla is on the rim of the aforementioned diverticulum. Impression:               - No gross lesions in esophagus proximally. LA                            Grade B esophagitis with no bleeding - distally.                           - Large hiatal hernia - 10 cm.                           - Gastritis. Biopsied.                           - No overt evidence of a volvulus on today's                             examination. This does not meen that she did not                            detorse herself if it had been, but not active.                           - No gross mucosal lesions in the duodenal bulb, in                            the first portion of the duodenum and in the second                            portion of the duodenum.                           - Non-bleeding duodenal diverticulum with ampulla                            on the rim of this diverticulum. Moderate Sedation:      Not Applicable - Patient had care per Anesthesia. Recommendation:           - The patient will be observed post-procedure,                            until all discharge criteria are met.                           - Return patient to ED for possible discharge same                            day.                           - Observe patient's clinical course.                           -  May transition to twice daily PPI x 31-month and                            then recommend consideration of maintaining PPI                            once daily thereafter if no issues arise.                           - Continue present medications otherwise.                           - Await pathology results.                           - Not clear that repeat EGD is required if patient                            is taking PPI therapy for esophagitis followup, but                            that can be considered/discussed in future if                            patient wants to be seen in followup clinic.                           - If patient has continued episodes, although overt                            Volvulus was not present, she could be at risk, and                            could also require hiatal hernia repair (that would                            need to be done with Thoracic surgery in the                            future) - fingers crossed this is not necessary.                             Transillumination of the distal body/antrum                            suggested potential PEG placement sites could be                            available however.                           - The findings and recommendations were discussed  with the patient.                           - The findings and recommendations were discussed                            with the patient's family.                           - The findings and recommendations were discussed                            with the referring physician. Procedure Code(s):        --- Professional ---                           [phone], Esophagogastroduodenoscopy, flexible,                            transoral; with biopsy, single or multiple Diagnosis Code(s):        --- Professional ---                           K20.90, Esophagitis, unspecified without bleeding                           K44.9, Diaphragmatic hernia without obstruction or                            gangrene                           K29.70, Gastritis, unspecified, without bleeding                           K31.89, Other diseases of stomach and duodenum                           R12, Heartburn                           R11.2, Nausea with vomiting, unspecified                           K57.10, Diverticulosis of small intestine without                            perforation or abscess without bleeding                           R93.3, Abnormal findings on diagnostic imaging of                            other parts of digestive tract CPT copyright 2019 American Medical Association. All rights reserved. The codes documented in this report are preliminary and upon coder review may  be revised to meet current compliance requirements. Justice Britain, MD 12/22/2021 2:29:18 PM Number of Addenda: 0

## 2021-12-22 NOTE — Progress Notes (Signed)
Left lower extremity venous duplex has been completed. Preliminary results can be found in CV Proc through chart review.   12/22/21 11:32 AM Olen Cordial RVT

## 2021-12-22 NOTE — H&P (Signed)
History and Physical    Patient: Beth Cantu OFB:[id number] DOB: 01-11-1925 DOA: 12/22/2021 DOS: the patient was seen and examined on 12/22/2021 PCP: [place], Betty G, MD  Patient coming from: ALF/ILF  Chief Complaint:  Chief Complaint  Patient presents with   Hematemesis   HPI: Beth Cantu is a 88 y.o. female from [name] with medical history significant of eczema, back pain, hyperlipidemia, varicose vein, LLE lymphedema, history of TIA who presents to the emergency department with a history of having multiple episodes of emesis since about 1700 after she ate dinner earlier than usual.  She does not have a history of gastritis, or PUD.  She had an episode of (heartburn) a few weeks ago after eating something spicy and this has been bothering her recently in the past few days.  She does not take aspirin or anticoagulants.  She went to bed and woke up early morning and noticed that she had vomited blood as there was some dried blood in her mouth and on the floor.  She initially has some mild abdominal pain.  She complains of burning substernal chest pain that she attributes to vomiting.  Denied diarrhea, melena or hematochezia. No fever, chills or night sweats. No sore throat, rhinorrhea, dyspnea, wheezing or hemoptysis.  No chest pain, palpitations, diaphoresis, PND, orthopnea or pitting edema of the lower extremities. No flank pain, dysuria, frequency or hematuria.  No polyuria, polydipsia, polyphagia or blurred vision.  ED course: Initial vital signs were temperature 97.8 F, pulse 105, respirations 23, BP 143/98 mmHg O2 sats 96% on room air.  Lab work: CBC is her white count 11.2, hemoglobin 13.4 g/dL platelets 782.  Lipase was 88 units/L.  CMP showed normal electrolytes when calcium is corrected.  Glucose 244, BUN 26 and creatinine 0.61 mg/dL.  Total body of 5.6 and albumin 3.1 g/dL.  Imaging: A portable 1 view chest radiograph showed chronic interstitial changes with asymmetric  elevation of the left hemidiaphragm.  There is a nodular density of the right base that may be atelectatic but follow-up is recommended.  CT abdomen and pelvis with contrast showed a large hiatal versus left diaphragmatic hernia with partially visible intrathoracic stomach.  Gastric volvulus was difficult to exclude by radiology.  There was no pneumoperitoneum.  No other acute inflammatory process in the abdomen.  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  Severe diverticulosis of the descending and sigmoid colon.  Left nephrolithiasis.  Aortic atherosclerosis.   Review of Systems: As mentioned in the history of present illness. All other systems reviewed and are negative.  Past Medical History:  Diagnosis Date   Back pain    Hyperlipidemia    Varicose veins    No past surgical history on file. Social History:  reports that she has never smoked. She has never used smokeless tobacco. She reports that she does not drink alcohol and does not use drugs.  Allergies  Allergen Reactions   Aspirin     Other reaction(s): Other (See Comments) Other Reaction: GI UPSET   Erythromycin     Other reaction(s): Other (See Comments) Makes her sick    Family History  Problem Relation Age of Onset   Cancer Father        bladder    Prior to Admission medications   Medication Sig Start Date End Date Taking? Authorizing Provider  augmented betamethasone dipropionate (DIPROLENE-AF) 0.05 % cream Apply topically 2 (two) times daily as needed. 12/11/21  Yes [provider]  Cyanocobalamin (VITAMIN B-12 PO) Take 1 tablet by mouth daily.   Yes [provider]  Ergocalciferol (VITAMIN D2) 400 units TABS Take 1 tablet by mouth daily.   Yes [provider]  Multiple Vitamins-Minerals (PRESERVISION AREDS 2 PO) Take 1 tablet by mouth daily.   Yes [provider]  nystatin cream (MYCOSTATIN) Apply 1 Application topically 2 (two) times daily as needed for dry skin.  12/07/21  Yes [place], Betty G, MD    Physical Exam: Vitals:   12/22/21 0900 12/22/21 1000 12/22/21 1043 12/22/21 1056  BP: 137/69 140/73    Pulse: 99 99    Resp: (!) 21 (!) 23    Temp:    97.8 F (36.6 C)  TempSrc:    Oral  SpO2: 97% 96% 96%   Weight:      Height:       Physical Exam Vitals and nursing note reviewed.  Constitutional:      General: She is awake.     Appearance: Normal appearance.  HENT:     Head: Normocephalic.     Mouth/Throat:     Mouth: Mucous membranes are moist.  Eyes:     General: No scleral icterus.    Pupils: Pupils are equal, round, and reactive to light.  Neck:     Vascular: No JVD.  Cardiovascular:     Rate and Rhythm: Normal rate and regular rhythm.     Heart sounds: S1 normal and S2 normal.  Pulmonary:     Effort: Pulmonary effort is normal.     Breath sounds: No decreased breath sounds, wheezing, rhonchi or rales.  Abdominal:     General: Bowel sounds are normal. There is no distension.     Palpations: Abdomen is soft.     Tenderness: There is no abdominal tenderness. There is no right CVA tenderness or left CVA tenderness.  Musculoskeletal:     Cervical back: Neck supple.     Right lower leg: No edema.     Left lower leg: No edema.  Skin:    General: Skin is warm and dry.  Neurological:     General: No focal deficit present.     Mental Status: She is alert and oriented to person, place, and time.  Psychiatric:        Mood and Affect: Mood normal.        Behavior: Behavior normal. Behavior is cooperative.    Data Reviewed:  There are no new results to review at this time.  Assessment and Plan: Principal Problem:   Hematemesis Observation/PCU. Monitor hematocrit and hemoglobin. Transfuse as needed. GI input/EGD procedure appreciated. We will continue to observe her overnight.  Active Problems:   Pure hypercholesterolemia Currently not on medical therapy.    Hyperglycemia Recheck fasting glucose in the morning.     Mild protein malnutrition (HCC) Protein supplementation.   Consider nutritional services evaluation.    Advance Care Planning:   Code Status: Full Code   Consults: [facility].  Family Communication:   Severity of Illness: The appropriate patient status for this patient is OBSERVATION. Observation status is judged to be reasonable and necessary in order to provide the required intensity of service to ensure the patient's safety. The patient's presenting symptoms, physical exam findings, and initial radiographic and laboratory data in the context of their medical condition is felt to place them at decreased risk for further clinical deterioration. Furthermore, it is anticipated that the patient will be medically stable for discharge  from the hospital within 2 midnights of admission.   Author: Bobette Mo, MD 12/22/2021 12:55 PM  For on call review [email or web address].   This document was prepared using Dragon voice recognition software and may contain some unintended transcription errors.

## 2021-12-22 NOTE — H&P (View-Only) (Signed)
Consultation  Referring Provider: [facility]/ Robb Matar MD Primary Care Physician:  [place], Betty G, MD Primary Gastroenterologist:  none  Reason for Consultation: Hematemesis, nausea /vomiting  HPI: Beth Cantu is a 88 y.o. female, generally in good health, who resides at [facility]. We are asked to see after she presented to the emergency room early this morning after multiple hours of persistent nausea vomiting and small-volume hematemesis which she describes as dark blood. She says she has been feeling fine, ate dinner yesterday around 5:00, then shortly thereafter developed what she describes as chest and upper abdominal pain/tightness followed by intractable nausea and vomiting.  She vomited repeatedly for 9 to 10 hours, when symptoms were persisting EMS was called she was brought to the emergency room.  She has been hemodynamically stable, she says she continued to vomit in the ambulance but when she came here her symptoms resolved and she feels pretty good currently.  She denies any ongoing chest or abdominal discomfort, no nausea, has not had any bowel movements. She reports 1 recent episode of what she felt was heartburn a few weeks ago.  Denies any ongoing problems with abdominal discomfort, no dysphagia or dyne aphasia, no chronic GERD. Labs in the ER showed hemoglobin 13.4/hematocrit 40.3, WBC 11.2/platelets 203 Lipase 88 Potassium 3.7/BUN 26/creatinine 0.9 LFTs within normal limits CT of the abdomen and pelvis shows a partially visible intrathoracic stomach herniated into the left hemothorax mildly fluid-filled intrathoracic stomach with questionable mucosal hyperenhancement esophagus not visible cannot exclude gastric volvulus, gallstones present severe diverticulosis and a large 6.2 cm proximal duodenum diverticulum.   She is not on any blood thinners  Past Medical History:  Diagnosis Date   Back pain    Hyperlipidemia    Varicose veins     No past surgical history on  file.  Prior to Admission medications   Medication Sig Start Date End Date Taking? Authorizing Provider  augmented betamethasone dipropionate (DIPROLENE-AF) 0.05 % cream Apply topically 2 (two) times daily as needed. 12/11/21  Yes [provider]  Cyanocobalamin (VITAMIN B-12 PO) Take 1 tablet by mouth daily.   Yes [provider]  Ergocalciferol (VITAMIN D2) 400 units TABS Take 1 tablet by mouth daily.   Yes [provider]  Multiple Vitamins-Minerals (PRESERVISION AREDS 2 PO) Take 1 tablet by mouth daily.   Yes [provider]  nystatin cream (MYCOSTATIN) Apply 1 Application topically 2 (two) times daily as needed for dry skin. 12/07/21  Yes [place], Betty G, MD    Current Facility-Administered Medications  Medication Dose Route Frequency Provider Last Rate Last Admin   0.9 % NaCl with KCl 20 mEq/ L  infusion   Intravenous Continuous Bobette Mo, MD       acetaminophen (TYLENOL) tablet 650 mg  650 mg Oral Q6H PRN Bobette Mo, MD       Or   acetaminophen (TYLENOL) suppository 650 mg  650 mg Rectal Q6H PRN Bobette Mo, MD       ondansetron [facility]) tablet 4 mg  4 mg Oral Q6H PRN Bobette Mo, MD       Or   ondansetron [facility]) injection 4 mg  4 mg Intravenous Q6H PRN Bobette Mo, MD       [START ON 12/25/2021] pantoprazole (PROTONIX) injection 40 mg  40 mg Intravenous Q12H Bobette Mo, MD       pantoprazole (PROTONIX) injection 40 mg  40 mg Intravenous Once Bobette Mo, MD  pantoprozole (PROTONIX) 80 mg /NS 100 mL infusion  8 mg/hr Intravenous Continuous Bobette Mo, MD       Current Outpatient Medications  Medication Sig Dispense Refill   augmented betamethasone dipropionate (DIPROLENE-AF) 0.05 % cream Apply topically 2 (two) times daily as needed.     Cyanocobalamin (VITAMIN B-12 PO) Take 1 tablet by mouth daily.     Ergocalciferol (VITAMIN D2) 400 units TABS Take 1 tablet by mouth daily.      Multiple Vitamins-Minerals (PRESERVISION AREDS 2 PO) Take 1 tablet by mouth daily.     nystatin cream (MYCOSTATIN) Apply 1 Application topically 2 (two) times daily as needed for dry skin. 60 g 4    Allergies as of 12/22/2021 - Review Complete 12/22/2021  Allergen Reaction Noted   Aspirin  12/03/2015   Erythromycin  12/03/2015    Family History  Problem Relation Age of Onset   Cancer Father        bladder    Social History   Socioeconomic History   Marital status: Widowed    Spouse name: Not on file   Number of children: Not on file   Years of education: Not on file   Highest education level: Not on file  Occupational History   Not on file  Tobacco Use   Smoking status: Never   Smokeless tobacco: Never  Substance and Sexual Activity   Alcohol use: No    Alcohol/week: 0.0 standard drinks of alcohol   Drug use: No   Sexual activity: Not Currently  Other Topics Concern   Not on file  Social History Narrative   Not on file   Social Determinants of Health   Financial Resource Strain: Low Risk  (04/04/2021)   Overall Financial Resource Strain (CARDIA)    Difficulty of Paying Living Expenses: Not hard at all  Food Insecurity: No Food Insecurity (04/04/2021)   Hunger Vital Sign    Worried About Running Out of Food in the Last Year: Never true    Ran Out of Food in the Last Year: Never true  Transportation Needs: No Transportation Needs (04/04/2021)   PRAPARE - Administrator, Civil Service (Medical): No    Lack of Transportation (Non-Medical): No  Physical Activity: Insufficiently Active (04/04/2021)   Exercise Vital Sign    Days of Exercise per Week: 3 days    Minutes of Exercise per Session: 20 min  Stress: No Stress Concern Present (04/04/2021)   [employer] of Occupational Health - Occupational Stress Questionnaire    Feeling of Stress : Not at all  Social Connections: Moderately Isolated (04/04/2021)   Social Connection and Isolation Panel  [NHANES]    Frequency of Communication with Friends and Family: Twice a week    Frequency of Social Gatherings with Friends and Family: Twice a week    Attends Religious Services: 1 to 4 times per year    Active Member of [employer] or Organizations: No    Attends Banker Meetings: Never    Marital Status: Widowed  Intimate Partner Violence: Not At Risk (04/04/2021)   Humiliation, Afraid, Rape, and Kick questionnaire    Fear of Current or Ex-Partner: No    Emotionally Abused: No    Physically Abused: No    Sexually Abused: No    Review of Systems: Pertinent positive and negative review of systems were noted in the above HPI section.  All other review of systems was otherwise negative.   Physical Exam: Vital signs in  last 24 hours: Temp:  [97.8 F (36.6 C)-98.3 F (36.8 C)] 98.3 F (36.8 C) (07/17 0830) Pulse Rate:  [80-106] 80 (07/17 0830) Resp:  [19-35] 35 (07/17 0830) BP: (125-149)/(53-98) 125/53 (07/17 0830) SpO2:  [93 %-96 %] 95 % (07/17 0830) Weight:  [76.3 kg] 76.3 kg (07/17 0438)   General:   Alert,  Well-developed, well-nourished, very elderly white female pleasant and cooperative in NAD Head:  Normocephalic and atraumatic. Eyes:  Sclera clear, no icterus.   Conjunctiva pink. Ears:  Normal auditory acuity. Nose:  No deformity, discharge,  or lesions. Mouth:  No deformity or lesions.   Neck:  Supple; no masses or thyromegaly. Lungs:  Clear throughout to auscultation.   No wheezes, crackles, or rhonchi.  Heart:  Regular rate and rhythm; systolic murmur Abdomen:  Soft,nontender, BS active,nonpalp mass or hsm.   Rectal: Not done Msk:  Symmetrical without gross deformities. . Pulses:  Normal pulses noted. Extremities:  Without clubbing or edema. Neurologic:  Alert and  oriented x4;  grossly normal neurologically. Skin:  Intact without significant lesions or rashes.. Psych:  Alert and cooperative. Normal mood and affect.  Intake/Output from previous day: No  intake/output data recorded. Intake/Output this shift: No intake/output data recorded.  Lab Results: Recent Labs    12/22/21 0536  WBC 11.2*  HGB 13.4  HCT 40.3  PLT 203   BMET Recent Labs    12/22/21 0536  NA 142  K 3.7  CL 110  CO2 23  GLUCOSE 244*  BUN 26*  CREATININE 0.61  CALCIUM 8.5*   LFT Recent Labs    12/22/21 0536  PROT 5.6*  ALBUMIN 3.1*  AST 21  ALT 15  ALKPHOS 51  BILITOT 0.6   PT/INR No results for input(s): "LABPROT", "INR" in the last 72 hours. Hepatitis Panel No results for input(s): "HEPBSAG", "HCVAB", "HEPAIGM", "HEPBIGM" in the last 72 hours.    IMPRESSION:  #1 89-year-old white female with acute onset of lower chest tightness/pain, with intractable nausea and vomiting and small-volume hematemesis yesterday about 5 PM after eating dinner. Patient had persistent symptoms for about 10 hours which have subsequently resolved since arrival to the emergency room  Hemoglobin normal at 13.4 CT of the abdomen and pelvis as outlined above shows a partially visible intrathoracic stomach which is herniated into the left hemithorax there was some questionable mucosal hyperenhancement and could not exclude gastric volvulus.  I suspect that she did have at least a partial volvulus accounting for her acute symptoms, her pain has completely resolved fortunately, so unlikely to have any vascular compromise currently  #2 cholelithiasis #3 Large duodenal diverticulum and severe colonic diverticulosis  PLAN: Keep n.p.o. Started on PPI infusion, will be able to change to every 12 hours Continue to trend hemoglobin She has been scheduled for upper endoscopy with Dr. Stark this afternoon.  Procedure was discussed in detail with the patient including indications risk and benefits and she is agreeable to proceed.  GI will follow with you   Neeko Pharo PA-C 12/22/2021, 9:11 AM    

## 2021-12-22 NOTE — Consult Note (Signed)
Consultation  Referring Provider: [facility]/ Robb Matar MD Primary Care Physician:  [place], Betty G, MD Primary Gastroenterologist:  none  Reason for Consultation: Hematemesis, nausea /vomiting  HPI: Beth Cantu is a 88 y.o. female, generally in good health, who resides at [facility]. We are asked to see after she presented to the emergency room early this morning after multiple hours of persistent nausea vomiting and small-volume hematemesis which she describes as dark blood. She says she has been feeling fine, ate dinner yesterday around 5:00, then shortly thereafter developed what she describes as chest and upper abdominal pain/tightness followed by intractable nausea and vomiting.  She vomited repeatedly for 9 to 10 hours, when symptoms were persisting EMS was called she was brought to the emergency room.  She has been hemodynamically stable, she says she continued to vomit in the ambulance but when she came here her symptoms resolved and she feels pretty good currently.  She denies any ongoing chest or abdominal discomfort, no nausea, has not had any bowel movements. She reports 1 recent episode of what she felt was heartburn a few weeks ago.  Denies any ongoing problems with abdominal discomfort, no dysphagia or dyne aphasia, no chronic GERD. Labs in the ER showed hemoglobin 13.4/hematocrit 40.3, WBC 11.2/platelets 203 Lipase 88 Potassium 3.7/BUN 26/creatinine 0.9 LFTs within normal limits CT of the abdomen and pelvis shows a partially visible intrathoracic stomach herniated into the left hemothorax mildly fluid-filled intrathoracic stomach with questionable mucosal hyperenhancement esophagus not visible cannot exclude gastric volvulus, gallstones present severe diverticulosis and a large 6.2 cm proximal duodenum diverticulum.   She is not on any blood thinners  Past Medical History:  Diagnosis Date   Back pain    Hyperlipidemia    Varicose veins     No past surgical history on  file.  Prior to Admission medications   Medication Sig Start Date End Date Taking? Authorizing Provider  augmented betamethasone dipropionate (DIPROLENE-AF) 0.05 % cream Apply topically 2 (two) times daily as needed. 12/11/21  Yes [provider]  Cyanocobalamin (VITAMIN B-12 PO) Take 1 tablet by mouth daily.   Yes [provider]  Ergocalciferol (VITAMIN D2) 400 units TABS Take 1 tablet by mouth daily.   Yes [provider]  Multiple Vitamins-Minerals (PRESERVISION AREDS 2 PO) Take 1 tablet by mouth daily.   Yes [provider]  nystatin cream (MYCOSTATIN) Apply 1 Application topically 2 (two) times daily as needed for dry skin. 12/07/21  Yes [place], Betty G, MD    Current Facility-Administered Medications  Medication Dose Route Frequency Provider Last Rate Last Admin   0.9 % NaCl with KCl 20 mEq/ L  infusion   Intravenous Continuous Bobette Mo, MD       acetaminophen (TYLENOL) tablet 650 mg  650 mg Oral Q6H PRN Bobette Mo, MD       Or   acetaminophen (TYLENOL) suppository 650 mg  650 mg Rectal Q6H PRN Bobette Mo, MD       ondansetron [facility]) tablet 4 mg  4 mg Oral Q6H PRN Bobette Mo, MD       Or   ondansetron [facility]) injection 4 mg  4 mg Intravenous Q6H PRN Bobette Mo, MD       [START ON 12/25/2021] pantoprazole (PROTONIX) injection 40 mg  40 mg Intravenous Q12H Bobette Mo, MD       pantoprazole (PROTONIX) injection 40 mg  40 mg Intravenous Once Bobette Mo, MD  pantoprozole (PROTONIX) 80 mg /NS 100 mL infusion  8 mg/hr Intravenous Continuous Bobette Mo, MD       Current Outpatient Medications  Medication Sig Dispense Refill   augmented betamethasone dipropionate (DIPROLENE-AF) 0.05 % cream Apply topically 2 (two) times daily as needed.     Cyanocobalamin (VITAMIN B-12 PO) Take 1 tablet by mouth daily.     Ergocalciferol (VITAMIN D2) 400 units TABS Take 1 tablet by mouth daily.      Multiple Vitamins-Minerals (PRESERVISION AREDS 2 PO) Take 1 tablet by mouth daily.     nystatin cream (MYCOSTATIN) Apply 1 Application topically 2 (two) times daily as needed for dry skin. 60 g 4    Allergies as of 12/22/2021 - Review Complete 12/22/2021  Allergen Reaction Noted   Aspirin  12/03/2015   Erythromycin  12/03/2015    Family History  Problem Relation Age of Onset   Cancer Father        bladder    Social History   Socioeconomic History   Marital status: Widowed    Spouse name: Not on file   Number of children: Not on file   Years of education: Not on file   Highest education level: Not on file  Occupational History   Not on file  Tobacco Use   Smoking status: Never   Smokeless tobacco: Never  Substance and Sexual Activity   Alcohol use: No    Alcohol/week: 0.0 standard drinks of alcohol   Drug use: No   Sexual activity: Not Currently  Other Topics Concern   Not on file  Social History Narrative   Not on file   Social Determinants of Health   Financial Resource Strain: Low Risk  (04/04/2021)   Overall Financial Resource Strain (CARDIA)    Difficulty of Paying Living Expenses: Not hard at all  Food Insecurity: No Food Insecurity (04/04/2021)   Hunger Vital Sign    Worried About Running Out of Food in the Last Year: Never true    Ran Out of Food in the Last Year: Never true  Transportation Needs: No Transportation Needs (04/04/2021)   PRAPARE - Administrator, Civil Service (Medical): No    Lack of Transportation (Non-Medical): No  Physical Activity: Insufficiently Active (04/04/2021)   Exercise Vital Sign    Days of Exercise per Week: 3 days    Minutes of Exercise per Session: 20 min  Stress: No Stress Concern Present (04/04/2021)   [employer] of Occupational Health - Occupational Stress Questionnaire    Feeling of Stress : Not at all  Social Connections: Moderately Isolated (04/04/2021)   Social Connection and Isolation Panel  [NHANES]    Frequency of Communication with Friends and Family: Twice a week    Frequency of Social Gatherings with Friends and Family: Twice a week    Attends Religious Services: 1 to 4 times per year    Active Member of [employer] or Organizations: No    Attends Banker Meetings: Never    Marital Status: Widowed  Intimate Partner Violence: Not At Risk (04/04/2021)   Humiliation, Afraid, Rape, and Kick questionnaire    Fear of Current or Ex-Partner: No    Emotionally Abused: No    Physically Abused: No    Sexually Abused: No    Review of Systems: Pertinent positive and negative review of systems were noted in the above HPI section.  All other review of systems was otherwise negative.   Physical Exam: Vital signs in  last 24 hours: Temp:  [97.8 F (36.6 C)-98.3 F (36.8 C)] 98.3 F (36.8 C) (07/17 0830) Pulse Rate:  [80-106] 80 (07/17 0830) Resp:  [19-35] 35 (07/17 0830) BP: (125-149)/(53-98) 125/53 (07/17 0830) SpO2:  [93 %-96 %] 95 % (07/17 0830) Weight:  [76.3 kg] 76.3 kg (07/17 0438)   General:   Alert,  Well-developed, well-nourished, very elderly white female pleasant and cooperative in NAD Head:  Normocephalic and atraumatic. Eyes:  Sclera clear, no icterus.   Conjunctiva pink. Ears:  Normal auditory acuity. Nose:  No deformity, discharge,  or lesions. Mouth:  No deformity or lesions.   Neck:  Supple; no masses or thyromegaly. Lungs:  Clear throughout to auscultation.   No wheezes, crackles, or rhonchi.  Heart:  Regular rate and rhythm; systolic murmur Abdomen:  Soft,nontender, BS active,nonpalp mass or hsm.   Rectal: Not done Msk:  Symmetrical without gross deformities. . Pulses:  Normal pulses noted. Extremities:  Without clubbing or edema. Neurologic:  Alert and  oriented x4;  grossly normal neurologically. Skin:  Intact without significant lesions or rashes.. Psych:  Alert and cooperative. Normal mood and affect.  Intake/Output from previous day: No  intake/output data recorded. Intake/Output this shift: No intake/output data recorded.  Lab Results: Recent Labs    12/22/21 0536  WBC 11.2*  HGB 13.4  HCT 40.3  PLT 203   BMET Recent Labs    12/22/21 0536  NA 142  K 3.7  CL 110  CO2 23  GLUCOSE 244*  BUN 26*  CREATININE 0.61  CALCIUM 8.5*   LFT Recent Labs    12/22/21 0536  PROT 5.6*  ALBUMIN 3.1*  AST 21  ALT 15  ALKPHOS 51  BILITOT 0.6   PT/INR No results for input(s): "LABPROT", "INR" in the last 72 hours. Hepatitis Panel No results for input(s): "HEPBSAG", "HCVAB", "HEPAIGM", "HEPBIGM" in the last 72 hours.    IMPRESSION:  #29 88 year old white female with acute onset of lower chest tightness/pain, with intractable nausea and vomiting and small-volume hematemesis yesterday about 5 PM after eating dinner. Patient had persistent symptoms for about 10 hours which have subsequently resolved since arrival to the emergency room  Hemoglobin normal at 13.4 CT of the abdomen and pelvis as outlined above shows a partially visible intrathoracic stomach which is herniated into the left hemithorax there was some questionable mucosal hyperenhancement and could not exclude gastric volvulus.  I suspect that she did have at least a partial volvulus accounting for her acute symptoms, her pain has completely resolved fortunately, so unlikely to have any vascular compromise currently  #2 cholelithiasis #3 Large duodenal diverticulum and severe colonic diverticulosis  PLAN: Keep n.p.o. Started on PPI infusion, will be able to change to every 12 hours Continue to trend hemoglobin She has been scheduled for upper endoscopy with Dr. Russella Dar this afternoon.  Procedure was discussed in detail with the patient including indications risk and benefits and she is agreeable to proceed.  GI will follow with you   Leasia Swann PA-C 12/22/2021, 9:11 AM

## 2021-12-22 NOTE — ED Provider Notes (Signed)
[facility]-EMERGENCY DEPT  Provider Note  CSN: [id number] Arrival date & time: 12/22/21 0429  History Chief Complaint  Patient presents with   Hematemesis    Beth Cantu is a 88 y.o. female who is relatively healthy, living at [facility]. She reports about 5pm yesterday evening she began to have vomiting, multiple episodes with some upper abdomen and chest pains. She noticed a short time ago that there was blood in her vomit. No diarrhea or fevers. Not on a blood thinner. No prior history of PUD or other stomach problems but she reports she has had some discomfort in her chest since eating something spicy a few days ago. She is feeling better now after EMS transport, was given NS but no antiemetics enroute.    Home Medications Prior to Admission medications   Medication Sig Start Date End Date Taking? Authorizing Provider  augmented betamethasone dipropionate (DIPROLENE-AF) 0.05 % cream Apply topically 2 (two) times daily as needed. 12/11/21  Yes [provider]  Cyanocobalamin (VITAMIN B-12 PO) Take 1 tablet by mouth daily.   Yes [provider]  Ergocalciferol (VITAMIN D2) 400 units TABS Take 1 tablet by mouth daily.   Yes [provider]  Multiple Vitamins-Minerals (PRESERVISION AREDS 2 PO) Take 1 tablet by mouth daily.   Yes [provider]  nystatin cream (MYCOSTATIN) Apply 1 Application topically 2 (two) times daily as needed for dry skin. 12/07/21  Yes [place], Betty G, MD     Allergies    Aspirin and Erythromycin   Review of Systems   Review of Systems Please see HPI for pertinent positives and negatives  Physical Exam BP 132/70   Pulse 99   Temp 97.8 F (36.6 C) (Oral)   Resp (!) 22   Ht 5\' 4"  (1.626 m)   Wt 76.3 kg   SpO2 95%   BMI 28.87 kg/m   Physical Exam Vitals and nursing note reviewed.  Constitutional:      Appearance: Normal appearance.  HENT:     Head: Normocephalic and  atraumatic.     Nose: Nose normal.     Mouth/Throat:     Mouth: Mucous membranes are moist.     Comments: Dried blood in mouth Eyes:     Extraocular Movements: Extraocular movements intact.     Conjunctiva/sclera: Conjunctivae normal.  Cardiovascular:     Rate and Rhythm: Tachycardia present.  Pulmonary:     Effort: Pulmonary effort is normal.     Breath sounds: Normal breath sounds.  Abdominal:     General: Abdomen is flat.     Palpations: Abdomen is soft.     Tenderness: There is no abdominal tenderness (mild epigastric).  Musculoskeletal:        General: No swelling. Normal range of motion.     Cervical back: Neck supple.  Skin:    General: Skin is warm and dry.  Neurological:     General: No focal deficit present.     Mental Status: She is alert.  Psychiatric:        Mood and Affect: Mood normal.     ED Results / Procedures / Treatments   EKG EKG Interpretation  Date/Time:  Monday December 22 2021 05:13:22 EDT Ventricular Rate:  106 PR Interval:  224 QRS Duration: 85 QT Interval:  324 QTC Calculation: 431 R Axis:   -31 Text Interpretation: Sinus tachycardia Multiple premature complexes, vent & supraven Prolonged PR interval Probable left atrial enlargement Left ventricular  hypertrophy Inferior infarct, old No old tracing to compare Confirmed by Susy Frizzle [phone]) on 12/22/2021 5:21:13 AM  Procedures Procedures  Medications Ordered in the ED Medications  sodium chloride (PF) 0.9 % injection (has no administration in time range)  lactated ringers bolus 1,000 mL (1,000 mLs Intravenous New Bag/Given 12/22/21 0534)  ondansetron (ZOFRAN) injection 4 mg (4 mg Intravenous Given 12/22/21 0534)  pantoprazole (PROTONIX) injection 40 mg (40 mg Intravenous Given 12/22/21 0533)  iohexol (OMNIPAQUE) 300 MG/ML solution 100 mL (100 mLs Intravenous Contrast Given 12/22/21 0641)    Initial Impression and Plan  Patient here with several hours of persistent vomiting, now with  hematemesis. Will check labs, give IVF and antiemetics. Send for CT to evaluate.   ED Course   Clinical Course as of 12/22/21 0729  Mon Dec 22, 2021  2725 I personally viewed the images from radiology studies and agree with radiologist interpretation: CXR concerning for possible free air under diaphragm. Will eval on planned CT.   [CS]  0602 CBC with mild leukocytosis, normal Hgb.  [CS]  0612 CMP unremarkable. Lipase is not significantly elevated.  [CS]  [id number] I personally viewed the images from radiology studies and agree with radiologist interpretation:  CT shows a large hiatal hernia, no other acute findings to explain her hematemesis. Plan admission for further management.  [CS]  [id number] Spoke with Dr. Robb Matar, Hospitalist, who will evaluate for admission. GI also paged.  [CS]    Clinical Course User Index [CS] Pollyann Savoy, MD     MDM Rules/Calculators/A&P Medical Decision Making Problems Addressed: Hematemesis with nausea: acute illness or injury Hiatal hernia: undiagnosed new problem with uncertain prognosis  Amount and/or Complexity of Data Reviewed Labs: ordered. Decision-making details documented in ED Course. Radiology: ordered and independent interpretation performed. Decision-making details documented in ED Course. ECG/medicine tests: ordered and independent interpretation performed. Decision-making details documented in ED Course.  Risk Prescription drug management. Decision regarding hospitalization.    Final Clinical Impression(s) / ED Diagnoses Final diagnoses:  Hematemesis with nausea  Hiatal hernia    Rx / DC Orders ED Discharge Orders     None        Pollyann Savoy, MD 12/22/21 [id number]

## 2021-12-22 NOTE — Anesthesia Postprocedure Evaluation (Signed)
Anesthesia Post Note  Patient: [name]  Procedure(s) Performed: ESOPHAGOGASTRODUODENOSCOPY (EGD) WITH PROPOFOL BIOPSY     Patient location during evaluation: PACU Anesthesia Type: MAC Level of consciousness: awake and alert Pain management: pain level controlled Vital Signs Assessment: post-procedure vital signs reviewed and stable Respiratory status: spontaneous breathing, nonlabored ventilation, respiratory function stable and patient connected to nasal cannula oxygen Cardiovascular status: stable and blood pressure returned to baseline Postop Assessment: no apparent nausea or vomiting Anesthetic complications: no   No notable events documented.  Last Vitals:  Vitals:   12/22/21 1321 12/22/21 1402  BP: (!) 152/59 (!) 147/49  Pulse: 81 86  Resp: 16 15  Temp: 36.6 C 37.1 C  SpO2: 96% 95%    Last Pain:  Vitals:   12/22/21 1402  TempSrc: Temporal  PainSc: 0-No pain                 [name]

## 2021-12-22 NOTE — Transfer of Care (Signed)
Immediate Anesthesia Transfer of Care Note  Patient: Beth Cantu  Procedure(s) Performed: ESOPHAGOGASTRODUODENOSCOPY (EGD) WITH PROPOFOL BIOPSY  Patient Location: PACU  Anesthesia Type:MAC  Level of Consciousness: awake, alert , oriented and patient cooperative  Airway & Oxygen Therapy: Patient Spontanous Breathing and Patient connected to nasal cannula oxygen  Post-op Assessment: Report given to RN and Post -op Vital signs reviewed and stable  Post vital signs: Reviewed and stable  Last Vitals:  Vitals Value Taken Time  BP 147/49 12/22/21 1401  Temp    Pulse 78 12/22/21 1402  Resp 17 12/22/21 1402  SpO2 95 % 12/22/21 1402  Vitals shown include unvalidated device data.  Last Pain:  Vitals:   12/22/21 1321  TempSrc: Temporal  PainSc: 0-No pain         Complications: No notable events documented.

## 2021-12-22 NOTE — ED Triage Notes (Signed)
BIBA from [place] for vomiting started at 1700, an hour ago noticed blood in vomit, minor chest pain   200/110 110 98% RA CBG 270 20 LW NS

## 2021-12-22 NOTE — Interval H&P Note (Signed)
History and Physical Interval Note:  12/22/2021 1:44 PM  Beth Cantu  has presented today for surgery, with the diagnosis of N/V , hematemesis, queation Gastric volvulus.  The various methods of treatment have been discussed with the patient and family. After consideration of risks, benefits and other options for treatment, the patient has consented to  Procedure(s): ESOPHAGOGASTRODUODENOSCOPY (EGD) WITH PROPOFOL (N/A) as a surgical intervention.  The patient's history has been reviewed, patient examined, no change in status, stable for surgery.  I have reviewed the patient's chart and labs.  Questions were answered to the patient's satisfaction.     [employer]

## 2021-12-22 NOTE — ED Notes (Signed)
Pt returned from endo at this time. VSS. Pt alert, answering questions. NAD

## 2021-12-22 NOTE — Plan of Care (Signed)
  Problem: Clinical Measurements: Goal: Diagnostic test results will improve Outcome: Progressing   Problem: Activity: Goal: Risk for activity intolerance will decrease Outcome: Progressing   Problem: Nutrition: Goal: Adequate nutrition will be maintained Outcome: Progressing   Problem: Coping: Goal: Level of anxiety will decrease Outcome: Progressing   

## 2021-12-22 NOTE — Anesthesia Preprocedure Evaluation (Signed)
Anesthesia Evaluation  Patient identified by MRN, date of birth, ID band Patient awake    Reviewed: Allergy & Precautions, H&P , NPO status , Patient's Chart, lab work & pertinent test results, reviewed documented beta blocker date and time   Airway Mallampati: II  TM Distance: >3 FB Neck ROM: full    Dental no notable dental hx. (+) Teeth Intact, Dental Advisory Given, Poor Dentition   Pulmonary neg pulmonary ROS,    Pulmonary exam normal breath sounds clear to auscultation       Cardiovascular Exercise Tolerance: Good negative cardio ROS   Rhythm:regular Rate:Normal     Neuro/Psych TIAnegative psych ROS   GI/Hepatic negative GI ROS, Neg liver ROS,   Endo/Other  negative endocrine ROS  Renal/GU negative Renal ROS  negative genitourinary   Musculoskeletal   Abdominal   Peds  Hematology negative hematology ROS (+)   Anesthesia Other Findings   Reproductive/Obstetrics negative OB ROS                             Anesthesia Physical Anesthesia Plan  ASA: 4  Anesthesia Plan: MAC   Post-op Pain Management:    Induction: Intravenous  PONV Risk Score and Plan: 2 and Propofol infusion  Airway Management Planned: Natural Airway  Additional Equipment: None  Intra-op Plan:   Post-operative Plan:   Informed Consent: I have reviewed the patients History and Physical, chart, labs and discussed the procedure including the risks, benefits and alternatives for the proposed anesthesia with the patient or authorized representative who has indicated his/her understanding and acceptance.     Dental Advisory Given  Plan Discussed with: CRNA and Anesthesiologist  Anesthesia Plan Comments:         Anesthesia Quick Evaluation

## 2021-12-23 ENCOUNTER — Encounter (HOSPITAL_COMMUNITY): Payer: Self-pay | Admitting: Gastroenterology

## 2021-12-23 ENCOUNTER — Encounter: Payer: Self-pay | Admitting: Gastroenterology

## 2021-12-23 DIAGNOSIS — K2100 Gastro-esophageal reflux disease with esophagitis, without bleeding: Secondary | ICD-10-CM

## 2021-12-23 DIAGNOSIS — K920 Hematemesis: Secondary | ICD-10-CM | POA: Diagnosis not present

## 2021-12-23 LAB — CBC
HCT: 34.8 % — ABNORMAL LOW (ref 36.0–46.0)
Hemoglobin: 11.3 g/dL — ABNORMAL LOW (ref 12.0–15.0)
MCH: 30.4 pg (ref 26.0–34.0)
MCHC: 32.5 g/dL (ref 30.0–36.0)
MCV: 93.5 fL (ref 80.0–100.0)
Platelets: 171 10*3/uL (ref 150–400)
RBC: 3.72 MIL/uL — ABNORMAL LOW (ref 3.87–5.11)
RDW: 14.6 % (ref 11.5–15.5)
WBC: 7.8 10*3/uL (ref 4.0–10.5)
nRBC: 0 % (ref 0.0–0.2)

## 2021-12-23 LAB — COMPREHENSIVE METABOLIC PANEL
ALT: 17 U/L (ref 0–44)
AST: 19 U/L (ref 15–41)
Albumin: 3 g/dL — ABNORMAL LOW (ref 3.5–5.0)
Alkaline Phosphatase: 44 U/L (ref 38–126)
Anion gap: 5 (ref 5–15)
BUN: 22 mg/dL (ref 8–23)
CO2: 25 mmol/L (ref 22–32)
Calcium: 8.7 mg/dL — ABNORMAL LOW (ref 8.9–10.3)
Chloride: 113 mmol/L — ABNORMAL HIGH (ref 98–111)
Creatinine, Ser: 0.46 mg/dL (ref 0.44–1.00)
GFR, Estimated: >60 mL/min (ref >60)
Glucose, Bld: 86 mg/dL (ref 70–99)
Potassium: 4.1 mmol/L (ref 3.5–5.1)
Sodium: 143 mmol/L (ref 135–145)
Total Bilirubin: 1.2 mg/dL (ref 0.3–1.2)
Total Protein: 5.1 g/dL — ABNORMAL LOW (ref 6.5–8.1)

## 2021-12-23 LAB — SURGICAL PATHOLOGY

## 2021-12-23 MED ORDER — PANTOPRAZOLE SODIUM 40 MG PO TBEC: 40.0000 mg | DELAYED_RELEASE_TABLET | Freq: Two times a day (BID) | ORAL | 3 refills | Status: DC

## 2021-12-23 MED ORDER — ONDANSETRON HCL 4 MG PO TABS: 4.0000 mg | ORAL_TABLET | Freq: Four times a day (QID) | ORAL | 0 refills | Status: DC | PRN

## 2021-12-23 NOTE — Discharge Summary (Signed)
Physician Discharge Summary  Beth Cantu SFK:[id number] DOB: 1925-01-19 DOA: 12/22/2021  PCP: [place], Betty G, MD  Admit date: 12/22/2021 Discharge date: 12/23/2021  Admitted From: Home Disposition: Home  Recommendations for Outpatient Follow-up:  Follow up with PCP in 1-2 weeks Please obtain BMP/CBC in one week Please follow up with GI Home Health: None Equipment/Devices: None Discharge Condition: Stable CODE STATUS: Full code Diet recommendation: Cardiac Brief/Interim Summary:88 y.o. female from [facility], [facility] with medical history significant of eczema, back pain, hyperlipidemia, varicose vein, LLE lymphedema, history of TIA who presents to the emergency department with a history of having multiple episodes of emesis since about 1700 after she ate dinner earlier than usual.  She does not have a history of gastritis, or PUD.  She had an episode of (heartburn) a few weeks ago after eating something spicy and this has been bothering her recently in the past few days.  She does not take aspirin or anticoagulants.  She went to bed and woke up early morning and noticed that she had vomited blood as there was some dried blood in her mouth and on the floor.  She initially has some mild abdominal pain.  She complains of burning substernal chest pain that she attributes to vomiting.  Denied diarrhea, melena or hematochezia. No fever, chills or night sweats. No sore throat, rhinorrhea, dyspnea, wheezing or hemoptysis.  No chest pain, palpitations, diaphoresis, PND, orthopnea or pitting edema of the lower extremities. No flank pain, dysuria, frequency or hematuria.  No polyuria, polydipsia, polyphagia or blurred vision.   ED course: Initial vital signs were temperature 97.8 F, pulse 105, respirations 23, BP 143/98 mmHg O2 sats 96% on room air.   Lab work: CBC is her white count 11.2, hemoglobin 13.4 g/dL platelets 174.  Lipase was 88 units/L.  CMP showed normal electrolytes when calcium is corrected.   Glucose 244, BUN 26 and creatinine 0.61 mg/dL.  Total body of 5.6 and albumin 3.1 g/dL.   Imaging: A portable 1 view chest radiograph showed chronic interstitial changes with asymmetric elevation of the left hemidiaphragm.  There is a nodular density of the right base that may be atelectatic but follow-up is recommended.  CT abdomen and pelvis with contrast showed a large hiatal versus left diaphragmatic hernia with partially visible intrathoracic stomach.  Gastric volvulus was difficult to exclude by radiology.  There was no pneumoperitoneum.  No other acute inflammatory process in the abdomen.  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  Severe diverticulosis of the descending and sigmoid colon.  Left nephrolithiasis.  Aortic atherosclerosis.    Discharge Diagnoses:  Principal Problem:   Hematemesis Active Problems:   Pure hypercholesterolemia   Hyperglycemia   Mild protein malnutrition (HCC)       Hematemesis EGD 12/22/2021 with grade B esophagitis and a large 10 cm hiatal hernia patchy moderate inflammation and friability with erosions found in the entire stomach.  Biopsies taken.  Large nonbleeding diverticulum in the area of the papilla.  No overt evidence of volvulus.  Patient was admitted overnight for observation.  Hemoglobin 11.3 on discharge which is down from 13.4 on admission.  She was discharged on Protonix 40 mg twice a day for a month and then 40 mg daily long-term.     Estimated body mass index is 29.25 kg/m as calculated from the following:   Height as of this encounter: 5\' 4"  (1.626 m).   Weight as of this encounter: 77.3 kg.  Discharge Instructions  Discharge Instructions     Diet - low sodium heart healthy   Complete by: As directed    Diet - low sodium heart healthy   Complete by: As directed    Increase activity slowly   Complete by: As directed    Increase activity slowly   Complete by: As directed       Allergies as of 12/23/2021        Reactions   Aspirin    Other reaction(s): Other (See Comments) Other Reaction: GI UPSET   Erythromycin    Other reaction(s): Other (See Comments) Makes her sick        Medication List     TAKE these medications    augmented betamethasone dipropionate 0.05 % cream Commonly known as: DIPROLENE-AF Apply topically 2 (two) times daily as needed.   nystatin cream Commonly known as: MYCOSTATIN Apply 1 Application topically 2 (two) times daily as needed for dry skin.   ondansetron 4 MG tablet Commonly known as: ZOFRAN Take 1 tablet (4 mg total) by mouth every 6 (six) hours as needed for nausea.   pantoprazole 40 MG tablet Commonly known as: PROTONIX Take 1 tablet (40 mg total) by mouth 2 (two) times daily before a meal.   PRESERVISION AREDS 2 PO Take 1 tablet by mouth daily.   VITAMIN B-12 PO Take 1 tablet by mouth daily.   Vitamin D2 10 MCG (400 UNIT) Tabs Take 1 tablet by mouth daily.        Follow-up Information     [place], Betty G, MD Follow up.   Specialty: Family Medicine Contact information: [address] [place] [address] [phone]         Mansouraty, Netty Starring., MD Follow up.   Specialties: Gastroenterology, Internal Medicine Contact information: [address] [place] [address] [phone]                Allergies  Allergen Reactions   Aspirin     Other reaction(s): Other (See Comments) Other Reaction: GI UPSET   Erythromycin     Other reaction(s): Other (See Comments) Makes her sick    Consultations: GI   Procedures/Studies: VAS [place] LOWER EXTREMITY VENOUS (DVT)  Result Date: 12/22/2021  Lower Venous DVT Study Patient Name:  Beth Cantu  Date of Exam:   12/22/2021 Medical Rec #: [id number]   Accession #:    [id number] Date of Birth: February 07, 1925   Patient Gender: F Patient Age:   40 years Exam Location:  [facility] Procedure:      VAS [place] LOWER EXTREMITY VENOUS (DVT) Referring Phys: DAVID ORTIZ  --------------------------------------------------------------------------------  Indications: Swelling.  Risk Factors: None identified. Limitations: Poor ultrasound/tissue interface. Comparison Study: No prior studies. Performing Technologist: Chanda Busing RVT  Examination Guidelines: A complete evaluation includes B-mode imaging, spectral Doppler, color Doppler, and power Doppler as needed of all accessible portions of each vessel. Bilateral testing is considered an integral part of a complete examination. Limited examinations for reoccurring indications may be performed as noted. The reflux portion of the exam is performed with the patient in reverse Trendelenburg.  +-----+---------------+---------+-----------+----------+--------------+ RIGHTCompressibilityPhasicitySpontaneityPropertiesThrombus Aging +-----+---------------+---------+-----------+----------+--------------+ CFV  Full           Yes      Yes                                 +-----+---------------+---------+-----------+----------+--------------+   +---------+---------------+---------+-----------+----------+--------------+ LEFT  CompressibilityPhasicitySpontaneityPropertiesThrombus Aging +---------+---------------+---------+-----------+----------+--------------+ CFV      Full           Yes      Yes                                 +---------+---------------+---------+-----------+----------+--------------+ SFJ      Full                                                        +---------+---------------+---------+-----------+----------+--------------+ FV Prox  Full                                                        +---------+---------------+---------+-----------+----------+--------------+ FV Mid   Full                                                        +---------+---------------+---------+-----------+----------+--------------+ FV DistalFull                                                         +---------+---------------+---------+-----------+----------+--------------+ PFV      Full                                                        +---------+---------------+---------+-----------+----------+--------------+ POP      Full           Yes      Yes                                 +---------+---------------+---------+-----------+----------+--------------+ PTV      Full                                                        +---------+---------------+---------+-----------+----------+--------------+ PERO     Full                                                        +---------+---------------+---------+-----------+----------+--------------+    Summary: RIGHT: - No evidence of common femoral vein obstruction.  LEFT: - There is no evidence of deep vein thrombosis in the lower extremity. However, portions of this examination were limited- see technologist comments above.  - No cystic structure found in the popliteal fossa.  *See table(s)  above for measurements and observations. Electronically signed by Lemar Livings MD on 12/22/2021 at 4:23:54 PM.    Final    CT Abdomen Pelvis W Contrast  Result Date: 12/22/2021 CLINICAL DATA:  88 year old female with nausea and vomiting. Vomiting blood. EXAM: CT ABDOMEN AND PELVIS WITH CONTRAST TECHNIQUE: Multidetector CT imaging of the abdomen and pelvis was performed using the standard protocol following bolus administration of intravenous contrast. RADIATION DOSE REDUCTION: This exam was performed according to the departmental dose-optimization program which includes automated exposure control, adjustment of the mA and/or kV according to patient size and/or use of iterative reconstruction technique. CONTRAST:  OMNIPAQUE IOHEXOL 300 MG/ML  SOLN COMPARISON:  Portable chest 0456 hours. FINDINGS: Lower chest: Partially visible intrathoracic stomach, herniated into the left hemithorax. This appears to be a relatively broad-based hiatal  diaphragmatic hernia visible on coronal image 61. Mildly fluid-filled intrathoracic stomach with questionable mucosal hyperenhancement. Gastric antrum proceeds into the abdomen. Esophagus is not visible. Tortuous descending thoracic aorta. Trace left pleural effusion with simple fluid density. No pericardial effusion. Mild right lung base scarring or atelectasis. Hepatobiliary: Negative liver. Trace cholelithiasis. No gallbladder inflammation. Pancreas: Partially atrophied. Spleen: Mild distortion related to left diaphragmatic hernia but otherwise negative. Adrenals/Urinary Tract: Normal adrenal glands. Nonobstructed kidneys. Left lower pole nephrolithiasis is 3-4 mm. Symmetric renal enhancement and contrast excretion. Decompressed ureters. Unremarkable bladder. Pelvic phleboliths. Stomach/Bowel: Gas distended rectum. Decompressed sigmoid colon although with severe diverticulosis throughout. Diverticulosis continues into the distal descending colon. No definite active inflammation in those segments. Redundant transverse colon with mild gas and retained stool. Right colon with cecum on a lax mesentery. Normal appendix coronal image 65. No large bowel inflammation. No dilated small bowel. Intrathoracic stomach. Large 6.2 cm diverticulum of the proximal duodenum (series 2, image 31) with an air-fluid level. Otherwise the duodenum appears decompressed. No definite regional inflammation. However, there is a 2.5 cm nonspecific nodular lesion of the root of the small bowel mesentery which is also inseparable from the distal duodenum in the midline (series 2, image 42 and series 5, image 62). Unclear whether this could be a collapsed distal duodenum diverticulum. No free air.  No free fluid identified. Vascular/Lymphatic: Aortoiliac calcified atherosclerosis. Major arterial structures in the abdomen and pelvis are patent. Portal venous system is patent. No lymphadenopathy identified. Reproductive: Surgically absent uterus.  Diminutive or absent ovaries. Other: No pelvic free fluid. Musculoskeletal: Osteopenia. No acute or suspicious osseous lesion identified. IMPRESSION: 1. Large hiatal versus left diaphragmatic hernia with partially visible intrathoracic stomach. Gastric volvulus difficult to exclude in this setting. 2. No pneumoperitoneum. Trace left pleural effusion. No free fluid in the abdomen. 3. No other acute or inflammatory process identified in the abdomen or pelvis. Large 6.2 cm proximal duodenal diverticulum. Possible collapsed diverticulum of the distal duodenum. Severe diverticulosis of the descending and sigmoid colon without active inflammation. Left nephrolithiasis. Aortic Atherosclerosis (ICD10-I70.0). Electronically Signed   By: Odessa Fleming M.D.   On: 12/22/2021 07:14   DG Chest Port 1 View  Result Date: 12/22/2021 CLINICAL DATA:  Vomiting with chest pain. EXAM: PORTABLE CHEST 1 VIEW COMPARISON:  None Available. FINDINGS: The cardio pericardial silhouette is enlarged. Interstitial markings are diffusely coarsened with chronic features. Asymmetric elevation left hemidiaphragm. Lucency under the left hemidiaphragm is likely related to gas in the stomach although intraperitoneal free air cannot be excluded. Nodular density noted at the right lung base, potentially atelectasis. Bones are diffusely demineralized. Telemetry leads overlie the chest. IMPRESSION: 1. Chronic interstitial changes with  asymmetric elevation left hemidiaphragm. 2. Nodular density at the right base may be atelectatic but follow-up recommended to ensure resolution. 3. Lucency under the left hemidiaphragm is likely related to gas in the stomach although intraperitoneal free air cannot be excluded. Decubitus abdominal x-ray or abdominal/pelvis CT could be used to further evaluate. Electronically Signed   By: Kennith CenterEric  Mansell M.D.   On: 12/22/2021 05:30   (Echo, Carotid, EGD, Colonoscopy, ERCP)    Subjective:  She is resting in bed anxious to go  home Discharge Exam: Vitals:   12/23/21 0801 12/23/21 1432  BP: (!) 131/56 (!) 165/76  Pulse: 67 72  Resp: 18 18  Temp: 98.5 F (36.9 C) 98.5 F (36.9 C)  SpO2: 93% 97%   Vitals:   12/23/21 0046 12/23/21 0510 12/23/21 0801 12/23/21 1432  BP: (!) 154/76 (!) 152/64 (!) 131/56 (!) 165/76  Pulse: 78 65 67 72  Resp: 20 20 18 18   Temp: 98.1 F (36.7 C) 98.2 F (36.8 C) 98.5 F (36.9 C) 98.5 F (36.9 C)  TempSrc: Oral Oral Oral Oral  SpO2: 95% 95% 93% 97%  Weight:      Height:        General: Pt is alert, awake, not in acute distress Cardiovascular: RRR, S1/S2 +, no rubs, no gallops Respiratory: CTA bilaterally, no wheezing, no rhonchi Abdominal: Soft, NT, ND, bowel sounds + Extremities: no edema, no cyanosis    The results of significant diagnostics from this hospitalization (including imaging, microbiology, ancillary and laboratory) are listed below for reference.     Microbiology: No results found for this or any previous visit (from the past 240 hour(s)).   Labs: BNP (last 3 results) No results for input(s): "BNP" in the last 8760 hours. Basic Metabolic Panel: Recent Labs  Lab 12/22/21 0536 12/23/21 0448  NA 142 143  K 3.7 4.1  CL 110 113*  CO2 23 25  GLUCOSE 244* 86  BUN 26* 22  CREATININE 0.61 0.46  CALCIUM 8.5* 8.7*   Liver Function Tests: Recent Labs  Lab 12/22/21 0536 12/23/21 0448  AST 21 19  ALT 15 17  ALKPHOS 51 44  BILITOT 0.6 1.2  PROT 5.6* 5.1*  ALBUMIN 3.1* 3.0*   Recent Labs  Lab 12/22/21 0536  LIPASE 88*   No results for input(s): "AMMONIA" in the last 168 hours. CBC: Recent Labs  Lab 12/22/21 0536 12/22/21 1024 12/22/21 2048 12/23/21 0448  WBC 11.2*  --   --  7.8  NEUTROABS 10.1*  --   --   --   HGB 13.4 12.5 13.4 11.3*  HCT 40.3 39.2 41.8 34.8*  MCV 91.6  --   --  93.5  PLT 203  --   --  171   Cardiac Enzymes: No results for input(s): "CKTOTAL", "CKMB", "CKMBINDEX", "TROPONINI" in the last 168  hours. BNP: Invalid input(s): "POCBNP" CBG: No results for input(s): "GLUCAP" in the last 168 hours. D-Dimer No results for input(s): "DDIMER" in the last 72 hours. Hgb A1c No results for input(s): "HGBA1C" in the last 72 hours. Lipid Profile No results for input(s): "CHOL", "HDL", "LDLCALC", "TRIG", "CHOLHDL", "LDLDIRECT" in the last 72 hours. Thyroid function studies No results for input(s): "TSH", "T4TOTAL", "T3FREE", "THYROIDAB" in the last 72 hours.  Invalid input(s): "FREET3" Anemia work up No results for input(s): "VITAMINB12", "FOLATE", "FERRITIN", "TIBC", "IRON", "RETICCTPCT" in the last 72 hours. Urinalysis    Component Value Date/Time   COLORURINE CANCELED 12/10/2021 1019   Sepsis Labs Recent Labs  Lab 12/22/21 0536 12/23/21 0448  WBC 11.2* 7.8   Microbiology No results found for this or any previous visit (from the past 240 hour(s)).   Time coordinating discharge: 38 minutes  SIGNED:   Alwyn Ren, MD  [facility] 12/23/2021, 4:59 PM

## 2021-12-23 NOTE — Care Management Obs Status (Signed)
MEDICARE OBSERVATION STATUS NOTIFICATION   Patient Details  Name: Beth Cantu MRN: [id number] Date of Birth: [birth year]   Medicare Observation Status Notification Given:  Yes    Armanda Heritage, RN 12/23/2021, 12:46 PM

## 2021-12-23 NOTE — Progress Notes (Addendum)
Patient ID: Beth Cantu, female   DOB: Jun 09, 1924, 88 y.o.   MRN: [id number]    Progress Note   Subjective  Day # 2 CC: abdominal/chest pain nausea vomiting hematemesis  EGD yesterday- LA Grade B esophagitis, large 10 cm hiatal hernia, patchy moderate inflammation and friability with erosions found in the entire stomach biopsies taken large nonbleeding diverticulum in the area of the papilla.  No overt evidence of volvulus.  WBC 7.8/hemoglobin 11.3/hematocrit 34.8 (down from 13.4 on admission)  Patient says she feels much better today, has just eaten solid food for breakfast, no complaints of pain, no nausea or vomiting.  No stools   Objective   Vital signs in last 24 hours: Temp:  [97.7 F (36.5 C)-98.7 F (37.1 C)] 98.5 F (36.9 C) (07/18 0801) Pulse Rate:  [65-99] 67 (07/18 0801) Resp:  [15-26] 18 (07/18 0801) BP: (121-159)/(45-76) 131/56 (07/18 0801) SpO2:  [93 %-98 %] 93 % (07/18 0801) Weight:  [76.3 kg-77.3 kg] 77.3 kg (07/17 2012) Last BM Date : 12/21/21 General:   Very elderly white female in NAD Heart:  Regular rate and rhythm; no murmurs Lungs: Respirations even and unlabored, lungs CTA bilaterally Abdomen:  Soft, nontender and nondistended. Normal bowel sounds. Extremities:  Without edema. Neurologic:  Alert and oriented,  grossly normal neurologically. Psych:  Cooperative. Normal mood and affect.  Intake/Output from previous day: 07/17 0701 - 07/18 0700 In: 1969.3 [P.O.:240; I.V.:729.3; IV Piggyback:1000] Out: 50 [Urine:50] Intake/Output this shift: No intake/output data recorded.  Lab Results: Recent Labs    12/22/21 0536 12/22/21 1024 12/22/21 2048 12/23/21 0448  WBC 11.2*  --   --  7.8  HGB 13.4 12.5 13.4 11.3*  HCT 40.3 39.2 41.8 34.8*  PLT 203  --   --  171   BMET Recent Labs    12/22/21 0536 12/23/21 0448  NA 142 143  K 3.7 4.1  CL 110 113*  CO2 23 25  GLUCOSE 244* 86  BUN 26* 22  CREATININE 0.61 0.46  CALCIUM 8.5* 8.7*   LFT Recent  Labs    12/23/21 0448  PROT 5.1*  ALBUMIN 3.0*  AST 19  ALT 17  ALKPHOS 44  BILITOT 1.2   PT/INR No results for input(s): "LABPROT", "INR" in the last 72 hours.  Studies/Results: VAS [place] LOWER EXTREMITY VENOUS (DVT)  Result Date: 12/22/2021  Lower Venous DVT Study Patient Name:  Beth Cantu  Date of Exam:   12/22/2021 Medical Rec #: [id number]   Accession #:    [id number] Date of Birth: [birth year]   Patient Gender: F Patient Age:   45 years Exam Location:  [facility] Procedure:      VAS [place] LOWER EXTREMITY VENOUS (DVT) Referring Phys: DAVID ORTIZ --------------------------------------------------------------------------------  Indications: Swelling.  Risk Factors: None identified. Limitations: Poor ultrasound/tissue interface. Comparison Study: No prior studies. Performing Technologist: Oliver Hum RVT  Examination Guidelines: A complete evaluation includes B-mode imaging, spectral Doppler, color Doppler, and power Doppler as needed of all accessible portions of each vessel. Bilateral testing is considered an integral part of a complete examination. Limited examinations for reoccurring indications may be performed as noted. The reflux portion of the exam is performed with the patient in reverse Trendelenburg.  +-----+---------------+---------+-----------+----------+--------------+ RIGHTCompressibilityPhasicitySpontaneityPropertiesThrombus Aging +-----+---------------+---------+-----------+----------+--------------+ CFV  Full           Yes      Yes                                 +-----+---------------+---------+-----------+----------+--------------+   +---------+---------------+---------+-----------+----------+--------------+  LEFT     CompressibilityPhasicitySpontaneityPropertiesThrombus Aging +---------+---------------+---------+-----------+----------+--------------+ CFV      Full           Yes      Yes                                  +---------+---------------+---------+-----------+----------+--------------+ SFJ      Full                                                        +---------+---------------+---------+-----------+----------+--------------+ FV Prox  Full                                                        +---------+---------------+---------+-----------+----------+--------------+ FV Mid   Full                                                        +---------+---------------+---------+-----------+----------+--------------+ FV DistalFull                                                        +---------+---------------+---------+-----------+----------+--------------+ PFV      Full                                                        +---------+---------------+---------+-----------+----------+--------------+ POP      Full           Yes      Yes                                 +---------+---------------+---------+-----------+----------+--------------+ PTV      Full                                                        +---------+---------------+---------+-----------+----------+--------------+ PERO     Full                                                        +---------+---------------+---------+-----------+----------+--------------+    Summary: RIGHT: - No evidence of common femoral vein obstruction.  LEFT: - There is no evidence of deep vein thrombosis in the lower extremity. However, portions of this examination were limited- see technologist comments above.  - No cystic structure found in the  popliteal fossa.  *See table(s) above for measurements and observations. Electronically signed by Lemar Livings MD on 12/22/2021 at 4:23:54 PM.    Final    CT Abdomen Pelvis W Contrast  Result Date: 12/22/2021 CLINICAL DATA:  88 year old female with nausea and vomiting. Vomiting blood. EXAM: CT ABDOMEN AND PELVIS WITH CONTRAST TECHNIQUE: Multidetector CT imaging of the abdomen and  pelvis was performed using the standard protocol following bolus administration of intravenous contrast. RADIATION DOSE REDUCTION: This exam was performed according to the departmental dose-optimization program which includes automated exposure control, adjustment of the mA and/or kV according to patient size and/or use of iterative reconstruction technique. CONTRAST:  OMNIPAQUE IOHEXOL 300 MG/ML  SOLN COMPARISON:  Portable chest 0456 hours. FINDINGS: Lower chest: Partially visible intrathoracic stomach, herniated into the left hemithorax. This appears to be a relatively broad-based hiatal diaphragmatic hernia visible on coronal image 61. Mildly fluid-filled intrathoracic stomach with questionable mucosal hyperenhancement. Gastric antrum proceeds into the abdomen. Esophagus is not visible. Tortuous descending thoracic aorta. Trace left pleural effusion with simple fluid density. No pericardial effusion. Mild right lung base scarring or atelectasis. Hepatobiliary: Negative liver. Trace cholelithiasis. No gallbladder inflammation. Pancreas: Partially atrophied. Spleen: Mild distortion related to left diaphragmatic hernia but otherwise negative. Adrenals/Urinary Tract: Normal adrenal glands. Nonobstructed kidneys. Left lower pole nephrolithiasis is 3-4 mm. Symmetric renal enhancement and contrast excretion. Decompressed ureters. Unremarkable bladder. Pelvic phleboliths. Stomach/Bowel: Gas distended rectum. Decompressed sigmoid colon although with severe diverticulosis throughout. Diverticulosis continues into the distal descending colon. No definite active inflammation in those segments. Redundant transverse colon with mild gas and retained stool. Right colon with cecum on a lax mesentery. Normal appendix coronal image 65. No large bowel inflammation. No dilated small bowel. Intrathoracic stomach. Large 6.2 cm diverticulum of the proximal duodenum (series 2, image 31) with an air-fluid level. Otherwise the  duodenum appears decompressed. No definite regional inflammation. However, there is a 2.5 cm nonspecific nodular lesion of the root of the small bowel mesentery which is also inseparable from the distal duodenum in the midline (series 2, image 42 and series 5, image 62). Unclear whether this could be a collapsed distal duodenum diverticulum. No free air.  No free fluid identified. Vascular/Lymphatic: Aortoiliac calcified atherosclerosis. Major arterial structures in the abdomen and pelvis are patent. Portal venous system is patent. No lymphadenopathy identified. Reproductive: Surgically absent uterus. Diminutive or absent ovaries. Other: No pelvic free fluid. Musculoskeletal: Osteopenia. No acute or suspicious osseous lesion identified. IMPRESSION: 1. Large hiatal versus left diaphragmatic hernia with partially visible intrathoracic stomach. Gastric volvulus difficult to exclude in this setting. 2. No pneumoperitoneum. Trace left pleural effusion. No free fluid in the abdomen. 3. No other acute or inflammatory process identified in the abdomen or pelvis. Large 6.2 cm proximal duodenal diverticulum. Possible collapsed diverticulum of the distal duodenum. Severe diverticulosis of the descending and sigmoid colon without active inflammation. Left nephrolithiasis. Aortic Atherosclerosis (ICD10-I70.0). Electronically Signed   By: Odessa Fleming M.D.   On: 12/22/2021 07:14   DG Chest Port 1 View  Result Date: 12/22/2021 CLINICAL DATA:  Vomiting with chest pain. EXAM: PORTABLE CHEST 1 VIEW COMPARISON:  None Available. FINDINGS: The cardio pericardial silhouette is enlarged. Interstitial markings are diffusely coarsened with chronic features. Asymmetric elevation left hemidiaphragm. Lucency under the left hemidiaphragm is likely related to gas in the stomach although intraperitoneal free air cannot be excluded. Nodular density noted at the right lung base, potentially atelectasis. Bones are diffusely demineralized. Telemetry  leads overlie the chest.  IMPRESSION: 1. Chronic interstitial changes with asymmetric elevation left hemidiaphragm. 2. Nodular density at the right base may be atelectatic but follow-up recommended to ensure resolution. 3. Lucency under the left hemidiaphragm is likely related to gas in the stomach although intraperitoneal free air cannot be excluded. Decubitus abdominal x-ray or abdominal/pelvis CT could be used to further evaluate. Electronically Signed   By: Kennith Center M.D.   On: 12/22/2021 05:30       Assessment / Plan:    #34 88 year old white female presenting with acute abdominal/chest pain, nausea, vomiting and hematemesis. Imaging with CT of the abdomen and pelvis shows an intrathoracic stomach herniated into the left hemithorax, there was some questionable mucosal hyperenhancement and could not exclude gastric volvulus.  EGD yesterday after her acute symptoms had subsided showed LA Grade B esophagitis, diffuse moderate gastritis with friability and erosions, biopsies pending.  No overt evidence of gastric volvulus  Suspect that she did have a transient volvulus that detorsed itself.  Patient much improved today, able to tolerate solid food, no abdominal or chest discomfort and no further nausea vomiting  #2 mild anemia secondary to self-limited mild GI blood loss with acute episode above and correction of mild hemoconcentration  #3 Cholelithiasis #4  Large duodenal diverticulum and severe colonic diverticulosis  Plan:  Patient is stable from a GI perspective, Diet as she tolerates but would stick to softer bland foods over the next few days Antireflux measures long term Protonix 40 mg twice daily x 1 month then plan Protonix 40 mg once daily long-term  We discussed possibility of having a recurrent episode, she is aware that if she has recurrence of symptoms to come on to the emergency room for evaluation,  GI will sign off, available if needed -thank you    LOS: 0 days    Amy EsterwoodPA-C  12/23/2021, 8:46 AM    Attending Physician Note   I have taken an interval history, reviewed the chart and examined the patient. I performed a substantive portion of this encounter, including complete performance of at least one of the key components, in conjunction with the APP. I agree with the APP's note, impression and recommendations with my edits. My additional impressions and recommendations are as follows.   Possible transient volvulus, possible GERD flare. EGD: LA Grade B esophagitis, a large hiatal hernia, diffuse moderate gastritis with friability and erosions, biopsies pending.  No evidence of gastric volvulus.   Pantoprazole 40 mg po bid x 1 month and then 40 mg po qd long term Follow antireflux measures long term OK for discharge from GI standpoint and GI signing off.  Outpatient GI follow up as needed with Dr. Meridee Score.   Claudette Head, MD [facility], [facility], for our on call provider

## 2021-12-23 NOTE — TOC Initial Note (Signed)
Transition of Care [facility]) - Initial/Assessment Note    Patient Details  Name: Beth Cantu MRN: [id number] Date of Birth: 1925-04-26  Transition of Care (TOC) CM/SW Contact:    Armanda Heritage, RN Phone Number: 12/23/2021, 12:49 PM  Clinical Narrative:                 TOC reviewed chart and spoke with daughter Beth Cantu who reports patient lives in independent living at [facility] and plan to return there at discharge.  Patient is fairly independent at baseline, has all needed DME.  Daughter made aware she will need to transport at discharge.  No TOC needs identified.  TOC will sign off, plan to return to independent living at discharge with no needs.  Expected Discharge Plan: Home/Self Care Barriers to Discharge: Continued Medical Work up   Patient Goals and CMS Choice Patient states their goals for this hospitalization and ongoing recovery are:: to go back home      Expected Discharge Plan and Services Expected Discharge Plan: Home/Self Care       Living arrangements for the past 2 months: Apartment Expected Discharge Date: 12/23/21                                    Prior Living Arrangements/Services Living arrangements for the past 2 months: Apartment Lives with:: Self Patient language and need for interpreter reviewed:: Yes Do you feel safe going back to the place where you live?: Yes      Need for Family Participation in Patient Care: Yes (Comment) Care giver support system in place?: Yes (comment)   Criminal Activity/Legal Involvement Pertinent to Current Situation/Hospitalization: No - Comment as needed  Activities of Daily Living Home Assistive Devices/Equipment: Cane (specify quad or straight), Walker (specify type), Eyeglasses (reading glasses) ADL Screening (condition at time of admission) Patient's cognitive ability adequate to safely complete daily activities?: Yes Is the patient deaf or have difficulty hearing?: No Does the patient have  difficulty seeing, even when wearing glasses/contacts?: Yes (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, 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) Does the patient have difficulty concentrating, remembering, or making decisions?: No Patient able to express need for assistance with ADLs?: Yes Does the patient have difficulty dressing or bathing?: No Independently performs ADLs?: Yes (appropriate for developmental age) (independent with a walker or cane) Does the patient have difficulty walking or climbing stairs?: Yes Weakness of Legs: Both Weakness of Arms/Hands: Both  Permission Sought/Granted                  Emotional Assessment Appearance:: Appears stated age     Orientation: : Oriented to Self, Oriented to Place, Oriented to  Time, Oriented to Situation   Psych Involvement: No (comment)  Admission diagnosis:  Hematemesis [K92.0] Hiatal hernia [K44.9] Hematemesis with nausea [K92.0] Patient Active Problem List   Diagnosis Date Noted   Hematemesis 12/22/2021   Hyperglycemia 12/22/2021   Mild protein malnutrition (HCC) 12/22/2021   Back pain 12/03/2021   Venous stasis dermatitis of left lower extremity 10/31/2019   Generalized osteoarthritis of multiple sites 10/31/2019   Pure hypercholesterolemia 04/14/2016   Varicose veins of left leg with edema 12/20/2015   TIA (transient ischemic attack) 12/20/2015   H/O: hysterectomy 05/17/2013   Hernia, rectovaginal 05/17/2013   Acne erythematosa 11/25/2011   Dermatitis, eczematoid 01/20/2011   H/O transient cerebral ischemia 01/20/2011   Phlebectasia 01/20/2011  PCP:  [place], Betty G, MD Pharmacy:   [id number] - [place] [place] - [address] AT [address] OF [facility] [address] [place] [address] Phone: [phone] Fax: [phone]     Social Determinants of Health (SDOH) Interventions    Readmission Risk Interventions     No data to display

## 2021-12-24 ENCOUNTER — Telehealth: Payer: Self-pay

## 2021-12-24 ENCOUNTER — Other Ambulatory Visit: Payer: Self-pay

## 2021-12-24 NOTE — Patient Outreach (Signed)
  Care Coordination [facility] Note Transition Care Management Follow-up Telephone Call Date of discharge and from where: 12/23/21 [facility]  How have you been since you were released from the hospital? Feeling better Any questions or concerns? No  Items Reviewed: Did the pt receive and understand the discharge instructions provided? Yes  Medications obtained and verified? Yes  Other? No  Any new allergies since your discharge? No  Dietary orders reviewed? Yes Do you have support at home? Yes   Home Care and Equipment/Supplies: Were home health services ordered? no If so, what is the name of the agency? N/A  Has the agency set up a time to come to the patient's home? not applicable Were any new equipment or medical supplies ordered?  No What is the name of the medical supply agency? N/A Were you able to get the supplies/equipment? not applicable Do you have any questions related to the use of the equipment or supplies? No  Functional Questionnaire: (I = Independent and D = Dependent) ADLs: I  Bathing/Dressing- I  Meal Prep- I  Eating- I  Maintaining continence- I  Transferring/Ambulation- I  Managing Meds-I  Follow up appointments reviewed:  PCP Hospital f/u appt confirmed? No  [facility] f/u appt confirmed? No   Are transportation arrangements needed? No  If their condition worsens, is the pt aware to call PCP or go to the Emergency Dept.? Yes Was the patient provided with contact information for the PCP's office or ED? Yes Was to pt encouraged to call back with questions or concerns? Yes  SDOH assessments and interventions completed:   Yes  Care Coordination Interventions Activated:  Yes Care Coordination Interventions:  PCP follow up appointment requested  Encounter Outcome:  Pt. Visit Completed Dudley Major RN, BSN,CCM, CDE Care Management Coordinator [facility] Care Management [phone]

## 2021-12-24 NOTE — Telephone Encounter (Signed)
Pt hesitant to schedule appt without talking to daughter. Offered to call daughter (on [place]) & pt states she will call us back after talking to her. Encouraged pt to schedule appt now & reschedule if needed in an effort to reserve space. Pt agrees; appt scheduled for July 26 at 1230.   Pt states she would like like DOS 12/03/21 lab results mailed to address on file (address verified)

## 2021-12-24 NOTE — Telephone Encounter (Signed)
Copy of lab results placed in the mail to patient.

## 2021-12-25 NOTE — Progress Notes (Signed)
Cardiology Office Note:    Date:  12/26/2021   ID:  Beth Cantu, DOB [birth year], MRN [id number]  PCP:  Beth, Betty G, MD  Cardiologist:  Lesleigh Noe, MD   Referring MD: Beth, Betty G, MD   Chief Complaint  Patient presents with   Cardiac Valve Problem    History of Present Illness:    Beth Cantu is a 88 y.o. female with a hx of heart murmur referred by Dr. Betty Beth.  Pre-existing H/o TIA, hyperlipidemia, and varicose veins.  She has no cardiac complaints.  She is 88 years of age and has some difficulty ambulating because of back discomfort.  She denies shortness of breath, syncope, chest pain, edema, and orthopnea.  She is accompanied by her daughter.  She has no history of heart disease.  Family states that Dr. Swaziland told them she should see a heart doctor because she has a murmur that has been present for about 3 years and she wants to get it checked out.  Past Medical History:  Diagnosis Date   Back pain    Hyperlipidemia    Varicose veins     Past Surgical History:  Procedure Laterality Date   BIOPSY  12/22/2021   Procedure: BIOPSY;  Surgeon: Meridee Score Netty Starring., MD;  Location: [facility];  Service: Gastroenterology;;   ESOPHAGOGASTRODUODENOSCOPY (EGD) WITH PROPOFOL N/A 12/22/2021   Procedure: ESOPHAGOGASTRODUODENOSCOPY (EGD) WITH PROPOFOL;  Surgeon: Lemar Lofty., MD;  Location: [facility];  Service: Gastroenterology;  Laterality: N/A;    Current Medications: Current Meds  Medication Sig   augmented betamethasone dipropionate (DIPROLENE-AF) 0.05 % cream Apply topically 2 (two) times daily as needed.   Cyanocobalamin (VITAMIN B-12 PO) Take 1 tablet by mouth daily.   Ergocalciferol (VITAMIN D2) 400 units TABS Take 1 tablet by mouth daily.   Multiple Vitamins-Minerals (PRESERVISION AREDS 2 PO) Take 1 tablet by mouth daily.   nystatin cream (MYCOSTATIN) Apply 1 Application topically 2 (two) times daily as needed for dry skin.    ondansetron (ZOFRAN) 4 MG tablet Take 1 tablet (4 mg total) by mouth every 6 (six) hours as needed for nausea.   pantoprazole (PROTONIX) 40 MG tablet Take 1 tablet (40 mg total) by mouth 2 (two) times daily before a meal.     Allergies:   Aspirin and Erythromycin   Social History   Socioeconomic History   Marital status: Widowed    Spouse name: Not on file   Number of children: Not on file   Years of education: Not on file   Highest education level: Not on file  Occupational History   Not on file  Tobacco Use   Smoking status: Never   Smokeless tobacco: Never  Substance and Sexual Activity   Alcohol use: No    Alcohol/week: 0.0 standard drinks of alcohol   Drug use: No   Sexual activity: Not Currently  Other Topics Concern   Not on file  Social History Narrative   Not on file   Social Determinants of Health   Financial Resource Strain: Low Risk  (04/04/2021)   Overall Financial Resource Strain (CARDIA)    Difficulty of Paying Living Expenses: Not hard at all  Food Insecurity: No Food Insecurity (04/04/2021)   Hunger Vital Sign    Worried About Running Out of Food in the Last Year: Never true    Ran Out of Food in the Last Year: Never true  Transportation Needs: No Transportation Needs (12/24/2021)   PRAPARE -  Hydrologist (Medical): No    Lack of Transportation (Non-Medical): No  Physical Activity: Insufficiently Active (04/04/2021)   Exercise Vital Sign    Days of Exercise per Week: 3 days    Minutes of Exercise per Session: 20 min  Stress: No Stress Concern Present (04/04/2021)   [place]    Feeling of Stress : Not at all  Social Connections: Moderately Isolated (04/04/2021)   Social Connection and Isolation Panel [NHANES]    Frequency of Communication with Friends and Family: Twice a week    Frequency of Social Gatherings with Friends and Family: Twice a week     Attends Religious Services: 1 to 4 times per year    Active Member of [employer] or Organizations: No    Attends Archivist Meetings: Never    Marital Status: Widowed     Family History: The patient's family history includes Cancer in her father.  ROS:   Please see the history of present illness.    Somewhat frail.  Severe back pain while sitting in the seat awaiting examination.  I was running a little behind and she contemplated leaving because of the back discomfort.  Relatively recent hospital admission for hematemesis, vomiting, with upper endoscopy demonstrating esophagitis, gastritis, and hiatal hernia. All other systems reviewed and are negative.  EKGs/Labs/Other Studies Reviewed:    The following studies were reviewed today: No prior cardiac imaging  EKG:  EKG most recent EKG was obtained during the hospital stay for esophagitis on 12/06/2021 demonstrating inferior Q waves in lead III and aVF, poor R wave progression, left axis deviation and sinus tachycardia.  Recent Labs: 12/23/2021: ALT 17; BUN 22; Creatinine, Ser 0.46; Hemoglobin 11.3; Platelets 171; Potassium 4.1; Sodium 143  Recent Lipid Panel    Component Value Date/Time   CHOL 183 12/03/2021 1147   TRIG 59.0 12/03/2021 1147   HDL 76.40 12/03/2021 1147   CHOLHDL 2 12/03/2021 1147   VLDL 11.8 12/03/2021 1147   LDLCALC 95 12/03/2021 1147    Physical Exam:    VS:  BP 128/66   Pulse 87   Ht 5\' 4"  (1.626 m)   Wt 167 lb 9.6 oz (76 kg)   SpO2 96%   BMI 28.77 kg/m     Wt Readings from Last 3 Encounters:  12/26/21 167 lb 9.6 oz (76 kg)  12/22/21 170 lb 6.7 oz (77.3 kg)  12/03/21 168 lb 2 oz (76.3 kg)     GEN: Elderly. No acute distress HEENT: Normal NECK: No JVD. LYMPHATICS: No lymphadenopathy CARDIAC: 1-2/6 right upper sternal systolic murmur and left parasternal 1/6 to 2/6 systolic murmur.  No diastolic murmur. IRR no gallop, or significant left lower extremity edema (question lymphedema) present for  greater than 10 years. VASCULAR: No normal Pulses. No bruits. RESPIRATORY:  Clear to auscultation without rales, wheezing or rhonchi  ABDOMEN: Soft, non-tender, non-distended, No pulsatile mass, MUSCULOSKELETAL: No deformity  SKIN: Warm and dry NEUROLOGIC:  Alert and oriented x 3 PSYCHIATRIC:  Normal affect   ASSESSMENT:    1. Heart murmur   2. TIA (transient ischemic attack)   3. Pure hypercholesterolemia    PLAN:    In order of problems listed above:  I am not impressed at all by her murmur.  She probably has aortic sclerosis and perhaps mitral regurgitation.  Through shared decision making, no evaluation of this murmur will be done.  At her age and without  obvious clinical signs of severe disease process, nothing will come of the study that will be of clinical benefit.  The patient preferred knowing that I did not feel there was a severe heart valve problem and simply chest to leave it alone unless she develops some symptoms. No follow-up is necessary.   Medication Adjustments/Labs and Tests Ordered: Current medicines are reviewed at length with the patient today.  Concerns regarding medicines are outlined above.  No orders of the defined types were placed in this encounter.  No orders of the defined types were placed in this encounter.   Patient Instructions  Medication Instructions:  Your physician recommends that you continue on your current medications as directed. Please refer to the Current Medication list given to you today.  *If you need a refill on your cardiac medications before your next appointment, please call your pharmacy*  Lab Work: NONE  Testing/Procedures: NONE  Follow-Up: At [facility], you and your health needs are our priority.  As part of our continuing mission to provide you with exceptional heart care, we have created designated Provider Care Teams.  These Care Teams include your primary Cardiologist (physician) and Advanced Practice Providers  (APPs -  Physician Assistants and Nurse Practitioners) who all work together to provide you with the care you need, when you need it.  Your next appointment:   As needed  The format for your next appointment:   In Person  Provider:   Lesleigh Noe, MD {   Important Information About Sugar         Signed, Lesleigh Noe, MD  12/26/2021 12:23 PM    [facility]

## 2021-12-26 ENCOUNTER — Ambulatory Visit: Payer: Medicare HMO | Admitting: Interventional Cardiology

## 2021-12-26 ENCOUNTER — Encounter: Payer: Self-pay | Admitting: Interventional Cardiology

## 2021-12-26 VITALS — BP 128/66 | HR 87 | Ht 64.0 in | Wt 167.6 lb

## 2021-12-26 DIAGNOSIS — G459 Transient cerebral ischemic attack, unspecified: Secondary | ICD-10-CM

## 2021-12-26 DIAGNOSIS — E7800 Pure hypercholesterolemia, unspecified: Secondary | ICD-10-CM | POA: Diagnosis not present

## 2021-12-26 DIAGNOSIS — R011 Cardiac murmur, unspecified: Secondary | ICD-10-CM

## 2021-12-26 NOTE — Patient Instructions (Signed)
Medication Instructions:  Your physician recommends that you continue on your current medications as directed. Please refer to the Current Medication list given to you today.  *If you need a refill on your cardiac medications before your next appointment, please call your pharmacy*  Lab Work: NONE  Testing/Procedures: NONE  Follow-Up: At [facility], you and your health needs are our priority.  As part of our continuing mission to provide you with exceptional heart care, we have created designated Provider Care Teams.  These Care Teams include your primary Cardiologist (physician) and Advanced Practice Providers (APPs -  Physician Assistants and Nurse Practitioners) who all work together to provide you with the care you need, when you need it.  Your next appointment:   As needed  The format for your next appointment:   In Person  Provider:   Henry W Smith III, MD {  Important Information About Sugar       

## 2021-12-30 NOTE — Progress Notes (Unsigned)
HPI: [name] is a 88 y.o. female with medical history significant of eczema, back pain, hyperlipidemia, varicose vein, LLE lymphedema, and TIA here today with her daughter to follow on recent hospitalization. Hospitalized from 12/22/2021 to 12/23/2021. [facility] on 12/24/21.  Presented to the ED the date of admission with several episodes of emesis and hematemesis. Chest x-ray showed chronic interstitial changes with asymmetric elevation of the left hemidiaphragm.  Nodular density on the right base that may be atelectatic. Abdominal CT showed a large hiatal hernia versus left diaphragmatic hernia with partially visible intrathoracic stomach.  There is a large 2.2 cm proximal duodenum diverticulum.  Severe diverticulosis of the descending and sigmoid colon.  Left nephrolithiasis.  Aortic atherosclerosis.  EGD on 12/22/21: Grade B esophagitis-10 cm hiatal hernia with patchy moderate inflammation and friability with erosions found in the entire stomach.  Large nonbleeding diverticulum in the area of the papilla. She was discharged on Protonix 40 mg twice daily. States that in the past she has had occasional heartburn. Negative for dysphagia, CP,nausea,or vomiting. She has not had any abdominal pain, nausea, or vomiting since hospital discharge. Her appetite is good, she is trying to eat healthier. No changes in bowel habits or melena, last bowel movement was today. She takes Metamucil Gummies to help with chronic constipation.  HH and PT was not deemed necessary, she is walking daily with a cane.  Lab Results  Component Value Date   WBC 7.8 12/23/2021   HGB 11.3 (L) 12/23/2021   HCT 34.8 (L) 12/23/2021   MCV 93.5 12/23/2021   PLT 171 12/23/2021   Lab Results  Component Value Date   CREATININE 0.46 12/23/2021   BUN 22 12/23/2021   NA 143 12/23/2021   K 4.1 12/23/2021   CL 113 (H) 12/23/2021   CO2 25 12/23/2021   She was also evaluated by cardiologist on 12/26/2021, she was concerned  about mild heart murmur and slightly irregular heart rate noted during visit. No further work up was recommended.  Review of Systems  Constitutional:  Positive for fatigue. Negative for activity change, appetite change and fever.  HENT:  Negative for mouth sores, nosebleeds and sore throat.   Eyes:  Negative for redness and visual disturbance.  Respiratory:  Negative for cough, shortness of breath and wheezing.   Cardiovascular:  Positive for leg swelling (Unchanged).  Genitourinary:  Negative for decreased urine volume, dysuria and hematuria.  Musculoskeletal:  Positive for back pain and gait problem.  Skin:  Negative for rash.  Neurological:  Negative for syncope, weakness and headaches.  Rest see pertinent positives and negatives per HPI.  Current Outpatient Medications on File Prior to Visit  Medication Sig Dispense Refill   augmented betamethasone dipropionate (DIPROLENE-AF) 0.05 % cream Apply topically 2 (two) times daily as needed.     Cyanocobalamin (VITAMIN B-12 PO) Take 1 tablet by mouth daily.     Ergocalciferol (VITAMIN D2) 400 units TABS Take 1 tablet by mouth daily.     Multiple Vitamins-Minerals (PRESERVISION AREDS 2 PO) Take 1 tablet by mouth daily.     nystatin cream (MYCOSTATIN) Apply 1 Application topically 2 (two) times daily as needed for dry skin. 60 g 4   ondansetron (ZOFRAN) 4 MG tablet Take 1 tablet (4 mg total) by mouth every 6 (six) hours as needed for nausea. 20 tablet 0   pantoprazole (PROTONIX) 40 MG tablet Take 1 tablet (40 mg total) by mouth 2 (two) times daily before a meal. 60 tablet 3  No current facility-administered medications on file prior to visit.    Past Medical History:  Diagnosis Date   Back pain    Hyperlipidemia    Varicose veins    Allergies  Allergen Reactions   Aspirin     Other reaction(s): Other (See Comments) Other Reaction: GI UPSET   Erythromycin     Other reaction(s): Other (See Comments) Makes her sick    Social  History   Socioeconomic History   Marital status: Widowed    Spouse name: Not on file   Number of children: Not on file   Years of education: Not on file   Highest education level: Not on file  Occupational History   Not on file  Tobacco Use   Smoking status: Never   Smokeless tobacco: Never  Substance and Sexual Activity   Alcohol use: No    Alcohol/week: 0.0 standard drinks of alcohol   Drug use: No   Sexual activity: Not Currently  Other Topics Concern   Not on file  Social History Narrative   Not on file   Social Determinants of Health   Financial Resource Strain: Low Risk  (04/04/2021)   Overall Financial Resource Strain (CARDIA)    Difficulty of Paying Living Expenses: Not hard at all  Food Insecurity: No Food Insecurity (04/04/2021)   Hunger Vital Sign    Worried About Running Out of Food in the Last Year: Never true    Ran Out of Food in the Last Year: Never true  Transportation Needs: No Transportation Needs (12/24/2021)   PRAPARE - Administrator, Civil Service (Medical): No    Lack of Transportation (Non-Medical): No  Physical Activity: Insufficiently Active (04/04/2021)   Exercise Vital Sign    Days of Exercise per Week: 3 days    Minutes of Exercise per Session: 20 min  Stress: No Stress Concern Present (04/04/2021)   [employer] of Occupational Health - Occupational Stress Questionnaire    Feeling of Stress : Not at all  Social Connections: Moderately Isolated (04/04/2021)   Social Connection and Isolation Panel [NHANES]    Frequency of Communication with Friends and Family: Twice a week    Frequency of Social Gatherings with Friends and Family: Twice a week    Attends Religious Services: 1 to 4 times per year    Active Member of [employer] or Organizations: No    Attends Banker Meetings: Never    Marital Status: Widowed   Vitals:   12/31/21 1232  BP: 120/70  Pulse: 83  Resp: 16  SpO2: 97%   Body mass index is 28.77  kg/m.  Physical Exam Vitals and nursing note reviewed.  Constitutional:      General: She is not in acute distress.    Appearance: She is well-developed.  HENT:     Head: Normocephalic and atraumatic.     Mouth/Throat:     Mouth: Mucous membranes are moist.     Pharynx: Oropharynx is clear.  Eyes:     Conjunctiva/sclera: Conjunctivae normal.  Cardiovascular:     Rate and Rhythm: Normal rate and regular rhythm. Occasional Extrasystoles (x 2) are present.    Heart sounds: Murmur (soft SEM RUSB) heard.     Comments: LLE lymphedema. Pulmonary:     Effort: Pulmonary effort is normal. No respiratory distress.     Breath sounds: Normal breath sounds.  Abdominal:     Palpations: Abdomen is soft. There is no mass.     Tenderness:  There is no abdominal tenderness.  Musculoskeletal:     Right lower leg: 1+ Edema present.     Left lower leg: 2+ Edema present.  Skin:    General: Skin is warm.     Findings: No erythema or rash.  Neurological:     General: No focal deficit present.     Mental Status: She is alert and oriented to person, place, and time.     Comments: Gain is unstable, assisted with a cane.  Psychiatric:     Comments: Well groomed, good eye contact.   ASSESSMENT AND PLAN:  Ms. Kaelan was seen today for hospitalization follow-up.  Diagnoses and all orders for this visit: Orders Placed This Encounter  Procedures   Ambulatory referral to Gastroenterology   Upper GI bleeding Resolved. Continue Protonix 40 mg twice daily. Instructed about warning signs. GI referral placed.  Atherosclerosis of aorta [facility]) Discussed findings on abdomen/pelvis CT. Currently she is not on statin medication.  Gastroesophageal reflux disease with esophagitis without hemorrhage GERD precautions discussed and recommended. Continue Protonix 40 mg twice daily, eventually dose of medication will be decreased to once daily.  She does not have needs refills at this time.  Iron deficiency  anemia, unspecified iron deficiency anemia type Mild. Hx of chronic fatigue, I do not think this is causing it. She has not had any other episode of hematemesis, so we decided not to repeat CBC today.  Hiatal hernia We discussed diagnosis, prognosis, and treatment options. She is not a surgical candidate. Continue GERD treatment.  Return if symptoms worsen or fail to improve.  Emmary Culbreath G. Swaziland, MD  [facility]. [place] office.

## 2021-12-31 ENCOUNTER — Telehealth: Payer: Self-pay | Admitting: Family Medicine

## 2021-12-31 ENCOUNTER — Encounter: Payer: Self-pay | Admitting: Family Medicine

## 2021-12-31 ENCOUNTER — Ambulatory Visit (INDEPENDENT_AMBULATORY_CARE_PROVIDER_SITE_OTHER): Payer: Medicare HMO | Admitting: Family Medicine

## 2021-12-31 VITALS — BP 120/70 | HR 83 | Resp 16 | Ht 64.0 in

## 2021-12-31 DIAGNOSIS — I700 Atherosclerosis of aorta: Secondary | ICD-10-CM

## 2021-12-31 DIAGNOSIS — K449 Diaphragmatic hernia without obstruction or gangrene: Secondary | ICD-10-CM | POA: Insufficient documentation

## 2021-12-31 DIAGNOSIS — K2100 Gastro-esophageal reflux disease with esophagitis, without bleeding: Secondary | ICD-10-CM | POA: Diagnosis not present

## 2021-12-31 DIAGNOSIS — K922 Gastrointestinal hemorrhage, unspecified: Secondary | ICD-10-CM | POA: Diagnosis not present

## 2021-12-31 DIAGNOSIS — D509 Iron deficiency anemia, unspecified: Secondary | ICD-10-CM

## 2021-12-31 NOTE — Telephone Encounter (Signed)
Noted  

## 2021-12-31 NOTE — Telephone Encounter (Signed)
Pt calling for Dr Swaziland to call another provider regarding an appointment she does not want to have. Not very clear in explaining.

## 2021-12-31 NOTE — Patient Instructions (Addendum)
A few things to remember from today's visit:  Upper GI bleeding - Plan: Ambulatory referral to Gastroenterology  Atherosclerosis of aorta [facility])  If you need refills please call your pharmacy. Do not use My Chart to request refills or for acute issues that need immediate attention.   Continue Protonix 40 mg 2 times per day. Referral to gastro has been placed.  Please be sure medication list is accurate. If a new problem present, please set up appointment sooner than planned today.

## 2021-12-31 NOTE — Telephone Encounter (Signed)
Pt called to say: Do not follow up on GI MD because she will speak to him herself.

## 2022-03-27 ENCOUNTER — Other Ambulatory Visit (INDEPENDENT_AMBULATORY_CARE_PROVIDER_SITE_OTHER): Payer: Medicare HMO

## 2022-03-27 ENCOUNTER — Encounter: Payer: Self-pay | Admitting: Gastroenterology

## 2022-03-27 ENCOUNTER — Ambulatory Visit: Payer: Medicare HMO | Admitting: Gastroenterology

## 2022-03-27 VITALS — BP 122/62 | HR 74 | Ht 64.0 in | Wt 161.0 lb

## 2022-03-27 DIAGNOSIS — K3189 Other diseases of stomach and duodenum: Secondary | ICD-10-CM | POA: Insufficient documentation

## 2022-03-27 DIAGNOSIS — K449 Diaphragmatic hernia without obstruction or gangrene: Secondary | ICD-10-CM | POA: Diagnosis not present

## 2022-03-27 DIAGNOSIS — K2100 Gastro-esophageal reflux disease with esophagitis, without bleeding: Secondary | ICD-10-CM | POA: Insufficient documentation

## 2022-03-27 DIAGNOSIS — K219 Gastro-esophageal reflux disease without esophagitis: Secondary | ICD-10-CM

## 2022-03-27 DIAGNOSIS — K562 Volvulus: Secondary | ICD-10-CM

## 2022-03-27 DIAGNOSIS — Z862 Personal history of diseases of the blood and blood-forming organs and certain disorders involving the immune mechanism: Secondary | ICD-10-CM | POA: Diagnosis not present

## 2022-03-27 DIAGNOSIS — R5383 Other fatigue: Secondary | ICD-10-CM

## 2022-03-27 LAB — CBC
HCT: 42.4 % (ref 36.0–46.0)
Hemoglobin: 13.9 g/dL (ref 12.0–15.0)
MCHC: 32.7 g/dL (ref 30.0–36.0)
MCV: 90.3 fl (ref 78.0–100.0)
Platelets: 209 10*3/uL (ref 150.0–400.0)
RBC: 4.69 Mil/uL (ref 3.87–5.11)
RDW: 13.5 % (ref 11.5–15.5)
WBC: 6.8 10*3/uL (ref 4.0–10.5)

## 2022-03-27 LAB — IBC + FERRITIN
Ferritin: 50.1 ng/mL (ref 10.0–291.0)
Iron: 103 ug/dL (ref 42–145)
Saturation Ratios: 30 % (ref 20.0–50.0)
TIBC: 343 ug/dL (ref 250.0–450.0)
Transferrin: 245 mg/dL (ref 212.0–360.0)

## 2022-03-27 LAB — VITAMIN B12: Vitamin B-12: 413 pg/mL (ref 211–911)

## 2022-03-27 LAB — FOLATE: Folate: >23.9 ng/mL (ref >5.9)

## 2022-03-27 MED ORDER — PANTOPRAZOLE SODIUM 40 MG PO TBEC: 40.0000 mg | DELAYED_RELEASE_TABLET | Freq: Two times a day (BID) | ORAL | 3 refills | Status: DC

## 2022-03-27 NOTE — Patient Instructions (Signed)
Your provider has requested that you go to the basement level for lab work before leaving today. Press "B" on the elevator. The lab is located at the first door on the left as you exit the elevator.  We have sent the following medications to your pharmacy for you to pick up at your convenience: Pantoprazole -  Continue twice through October then try to decrease to once daily  Follow up in 1 year, sooner if needed.   _______________________________________________________  If you are age 42 or older, your body mass index should be between 23-30. Your Body mass index is 27.64 kg/m. If this is out of the aforementioned range listed, please consider follow up with your Primary Care Provider.  If you are age 82 or younger, your body mass index should be between 19-25. Your Body mass index is 27.64 kg/m. If this is out of the aformentioned range listed, please consider follow up with your Primary Care Provider.   ________________________________________________________  The [facility] providers would like to encourage you to use [facility] to communicate with providers for non-urgent requests or questions.  Due to long hold times on the telephone, sending your provider a message by [facility] may be a faster and more efficient way to get a response.  Please allow 48 business hours for a response.  Please remember that this is for non-urgent requests.  _______________________________________________________  Thank you for choosing me and [facility].  Dr. Rush Landmark

## 2022-03-27 NOTE — Progress Notes (Signed)
[place] VISIT   Primary Care Provider Martinique, Betty G, MD [place] [address] [phone]   Patient Profile: Beth Cantu is a 88 y.o. female with a pmh significant for varicose veins, aortic atherosclerosis, prior TIA, status post hysterectomy, nephrolithiasis, large hiatal hernia, GERD with esophagitis, duodenal diverticula, colonic diverticulosis.  The patient presents to the [facility] for an evaluation and management of problem(s) noted below:  Problem List 1. Hiatal hernia   2. Gastroesophageal reflux disease with esophagitis without hemorrhage   3. Gastric volvulus   4. History of anemia   5. Fatigue, unspecified type     History of Present Illness This is the patient's first visit to the outpatient [place] clinic and she is accompanied by her daughter.  I met the patient back in July while I was on service in the hospital when she presented with nausea and vomiting and concern for coffee-ground emesis and a potential gastric volvulus and hiatal hernia.  An upper endoscopy was performed which showed evidence of a large hiatal hernia but no overt volvulus at the time.  She had some grade B esophagitis as well and PPI therapy was continued.  Since her hospitalization and being discharged she has done quite well.  She continues taking her PPI twice daily.  She has not had any significant symptoms.  The patient follow-up with her PCP and had been noted to be doing well also.  She is almost out of her PPI and is asking about the role of continued therapy versus if she can come off therapy.  She takes it appropriately 30 minutes before meals.  The patient reports a previous colonoscopy more than 20 years ago.  She has not had anemia labs rechecked since she was hospitalized but is interested in it because she has had some minor fatigue that has been persisting recently.  GI Review of Systems Positive as  above Negative for dysphagia, odynophagia, nausea, vomiting, pain, bloating, alteration of bowel habits, melena, hematochezia  Review of Systems General: Denies fevers/chills/weight loss unintentionally Cardiovascular: Denies chest pain Pulmonary: Denies shortness of breath Gastroenterological: See HPI Genitourinary: Denies darkened urine Hematological: Denies easy bruising/bleeding Dermatological: Denies jaundice Psychological: Mood is stable   Medications Current Outpatient Medications  Medication Sig Dispense Refill   augmented betamethasone dipropionate (DIPROLENE-AF) 0.05 % cream Apply topically 2 (two) times daily as needed.     Cyanocobalamin (VITAMIN B-12 PO) Take 1 tablet by mouth daily.     Ergocalciferol (VITAMIN D2) 400 units TABS Take 1 tablet by mouth daily.     Multiple Vitamins-Minerals (PRESERVISION AREDS 2 PO) Take 1 tablet by mouth daily.     nystatin cream (MYCOSTATIN) Apply 1 Application topically 2 (two) times daily as needed for dry skin. 60 g 4   ondansetron (ZOFRAN) 4 MG tablet Take 1 tablet (4 mg total) by mouth every 6 (six) hours as needed for nausea. (Patient not taking: Reported on 03/27/2022) 20 tablet 0   pantoprazole (PROTONIX) 40 MG tablet Take 1 tablet (40 mg total) by mouth 2 (two) times daily before a meal. 60 tablet 3   No current facility-administered medications for this visit.    Allergies Allergies  Allergen Reactions   Aspirin     Other reaction(s): Other (See Comments) Other Reaction: GI UPSET   Erythromycin     Other reaction(s): Other (See Comments) Makes her sick    Histories Past Medical History:  Diagnosis Date   Back  pain    Hyperlipidemia    Varicose veins    Past Surgical History:  Procedure Laterality Date   BIOPSY  12/22/2021   Procedure: BIOPSY;  Surgeon: Rush Landmark Telford Nab., MD;  Location: [facility];  Service: Gastroenterology;;   ESOPHAGOGASTRODUODENOSCOPY (EGD) WITH PROPOFOL N/A 12/22/2021   Procedure:  ESOPHAGOGASTRODUODENOSCOPY (EGD) WITH PROPOFOL;  Surgeon: Irving Copas., MD;  Location: [facility];  Service: Gastroenterology;  Laterality: N/A;   Social History   Socioeconomic History   Marital status: Widowed    Spouse name: Not on file   Number of children: Not on file   Years of education: Not on file   Highest education level: Not on file  Occupational History   Not on file  Tobacco Use   Smoking status: Never   Smokeless tobacco: Never  Vaping Use   Vaping Use: Never used  Substance and Sexual Activity   Alcohol use: No    Alcohol/week: 0.0 standard drinks of alcohol   Drug use: No   Sexual activity: Not Currently  Other Topics Concern   Not on file  Social History Narrative   Not on file   Social Determinants of Health   Financial Resource Strain: Low Risk  (04/04/2021)   Overall Financial Resource Strain (CARDIA)    Difficulty of Paying Living Expenses: Not hard at all  Food Insecurity: No Food Insecurity (04/04/2021)   Hunger Vital Sign    Worried About Running Out of Food in the Last Year: Never true    [place] in the Last Year: Never true  Transportation Needs: No Transportation Needs (12/24/2021)   PRAPARE - Hydrologist (Medical): No    Lack of Transportation (Non-Medical): No  Physical Activity: Insufficiently Active (04/04/2021)   Exercise Vital Sign    Days of Exercise per Week: 3 days    Minutes of Exercise per Session: 20 min  Stress: No Stress Concern Present (04/04/2021)   [place]    Feeling of Stress : Not at all  Social Connections: Moderately Isolated (04/04/2021)   Social Connection and Isolation Panel [NHANES]    Frequency of Communication with Friends and Family: Twice a week    Frequency of Social Gatherings with Friends and Family: Twice a week    Attends Religious Services: 1 to 4 times per year    Active Member of [employer]  or Organizations: No    Attends Archivist Meetings: Never    Marital Status: Widowed  Intimate Partner Violence: Not At Risk (04/04/2021)   Humiliation, Afraid, Rape, and Kick questionnaire    Fear of Current or Ex-Partner: No    Emotionally Abused: No    Physically Abused: No    Sexually Abused: No   Family History  Problem Relation Age of Onset   Cancer Father        bladder   Colon cancer Neg Hx    Esophageal cancer Neg Hx    Inflammatory bowel disease Neg Hx    Liver disease Neg Hx    Pancreatic cancer Neg Hx    Rectal cancer Neg Hx    Stomach cancer Neg Hx    I have reviewed her medical, social, and family history in detail and updated the electronic medical record as necessary.    PHYSICAL EXAMINATION  BP 122/62   Pulse 74   Ht _0  (1.626 m)   Wt 161 lb (73 kg)  BMI 27.64 kg/m  Wt Readings from Last 3 Encounters:  03/27/22 161 lb (73 kg)  12/26/21 167 lb 9.6 oz (76 kg)  12/22/21 170 lb 6.7 oz (77.3 kg)  GEN: NAD, appears much younger than stated age, doesn't appear chronically ill, accompanied by daughter PSYCH: Cooperative, without pressured speech EYE: Conjunctivae pink, sclerae anicteric ENT: MMM CV: Nontachycardic RESP: No audible wheezing GI: NABS, soft, NT/ND, without rebound  MSK/EXT: No significant lower extremity edema SKIN: No jaundice NEURO:  Alert & Oriented x 3, no focal deficits   REVIEW OF DATA  I reviewed the following data at the time of this encounter:  GI Procedures and Studies  July 2023 EGD - No gross lesions in esophagus proximally. LA Grade B esophagitis with no bleeding - distally. - Large hiatal hernia - 10 cm. - Gastritis. Biopsied. - No overt evidence of a volvulus on today's examination. This does not mean that she did not detorse herself if it had been, but not active. - No gross mucosal lesions in the duodenal bulb, in the first portion of the duodenum and in the second portion of the duodenum. -  Non-bleeding duodenal diverticulum with ampulla on the rim of this diverticulum.  Pathology FINAL MICROSCOPIC DIAGNOSIS:  A. STOMACH, BIOPSY:  - Gastric antral and oxyntic mucosa with no specific histopathologic  changes  - Helicobacter pylori-like organisms are not identified on routine HE  stain   Laboratory Studies  Reviewed those in epic  Imaging Studies  July 2023 CT abdomen pelvis with contrast IMPRESSION: 1. Large hiatal versus left diaphragmatic hernia with partially visible intrathoracic stomach. Gastric volvulus difficult to exclude in this setting. 2. No pneumoperitoneum. Trace left pleural effusion. No free fluid in the abdomen. 3. No other acute or inflammatory process identified in the abdomen or pelvis. Large 6.2 cm proximal duodenal diverticulum. Possible collapsed diverticulum of the distal duodenum. Severe diverticulosis of the descending and sigmoid colon without active inflammation. Left nephrolithiasis. Aortic Atherosclerosis (ICD10-I70.0).   ASSESSMENT  Ms. Kenealy is a 88 y.o. female with a pmh significant for varicose veins, aortic atherosclerosis, prior TIA, status post hysterectomy, nephrolithiasis, large hiatal hernia, GERD with esophagitis, duodenal diverticula, colonic diverticulosis.  The patient is seen today for evaluation and management of:  1. Hiatal hernia   2. Gastroesophageal reflux disease with esophagitis without hemorrhage   3. Gastric volvulus   4. History of anemia   5. Fatigue, unspecified type    The patient is clinically and hemodynamically stable.  Her presentation to the hospital was most likely result of a hiatal hernia though the possibility of a slight gastric volvulus could not be excluded by the time we performed upper endoscopy although she had no evidence of significant pneumoperitoneum or significant ischemic like changes that would be more suggestive of that.  With that being said she has done quite well on PPI therapy.  We  will plan to try to down titrate her but I expect she would benefit from remaining on this lifelong if possible.  We will continue twice daily PPI through October.  In November she will go to once daily PPI and see how she does with that.  If she continues to do well then we will maintain her on once daily and further titrate her downward if possible over the course of the coming.  As patient has had some increased fatigue we will recheck her anemia labs since she was anemic in the hospital but she has not had any overt GI  bleeding so hopefully we do not find any evidence or issues from that standpoint.  If all is well we will see her back in a year otherwise.  I do not think that she would benefit at this point in time in her life for hiatal hernia repair or gastropexy without overt volvulus having been found.  Hopefully we are right.  Time will tell.  All patient questions were answered to the best of my ability, and the patient agrees to the aforementioned plan of action with follow-up as indicated.   PLAN  Anemia labs for further evaluation of fatigue and previous anemia as outlined below Continue twice daily PPI through October Beginning in November trial down titration to once daily PPI (remain taking 30 minutes before a meal) No plan for surgical hiatal hernia repair   Orders Placed This Encounter  Procedures   CBC   IBC + Ferritin   B12   Folate    New Prescriptions   No medications on file   Modified Medications   Modified Medication Previous Medication   PANTOPRAZOLE (PROTONIX) 40 MG TABLET pantoprazole (PROTONIX) 40 MG tablet      Take 1 tablet (40 mg total) by mouth 2 (two) times daily before a meal.    Take 1 tablet (40 mg total) by mouth 2 (two) times daily before a meal.    Planned Follow Up No follow-ups on file.   Total Time in Face-to-Face and in Coordination of Care for patient including independent/personal interpretation/review of prior testing, medical history,  examination, medication adjustment, communicating results with the patient directly, and documentation within the EHR is 25 minutes.   Justice Britain, MD [facility] Office # [phone]

## 2022-04-08 ENCOUNTER — Ambulatory Visit (INDEPENDENT_AMBULATORY_CARE_PROVIDER_SITE_OTHER): Payer: Medicare HMO

## 2022-04-08 VITALS — Ht 64.0 in | Wt 161.0 lb

## 2022-04-08 DIAGNOSIS — Z0000 Encounter for general adult medical examination without abnormal findings: Secondary | ICD-10-CM

## 2022-04-08 NOTE — Patient Instructions (Addendum)
Ms. Beth Cantu , Thank you for taking time to come for your Medicare Wellness Visit. I appreciate your ongoing commitment to your health goals. Please review the following plan we discussed and let me know if I can assist you in the future.   These are the goals we discussed:  Goals       No current goals (pt-stated)        This is a list of the screening recommended for you and due dates:  Health Maintenance  Topic Date Due   COVID-19 Vaccine (4 - Pfizer risk series) 06/12/2022*   Zoster (Shingles) Vaccine (1 of 2) 06/12/2022*   Flu Shot  09/06/2022*   Medicare Annual Wellness Visit  04/09/2023   Pneumonia Vaccine  Completed   HPV Vaccine  Aged Out   DEXA scan (bone density measurement)  Discontinued   Tetanus Vaccine  Discontinued  *Topic was postponed. The date shown is not the original due date.    Advanced directives: Please bring a copy of your health care power of attorney and living will to the office to be added to your chart at your convenience.   Conditions/risks identified: None  Next appointment: Follow up in one year for your annual wellness visit     Preventive Care 65 Years and Older, Female Preventive care refers to lifestyle choices and visits with your health care provider that can promote health and wellness. What does preventive care include? A yearly physical exam. This is also called an annual well check. Dental exams once or twice a year. Routine eye exams. Ask your health care provider how often you should have your eyes checked. Personal lifestyle choices, including: Daily care of your teeth and gums. Regular physical activity. Eating a healthy diet. Avoiding tobacco and drug use. Limiting alcohol use. Practicing safe sex. Taking low-dose aspirin every day. Taking vitamin and mineral supplements as recommended by your health care provider. What happens during an annual well check? The services and screenings done by your health care provider during  your annual well check will depend on your age, overall health, lifestyle risk factors, and family history of disease. Counseling  Your health care provider may ask you questions about your: Alcohol use. Tobacco use. Drug use. Emotional well-being. Home and relationship well-being. Sexual activity. Eating habits. History of falls. Memory and ability to understand (cognition). Work and work Statistician. Reproductive health. Screening  You may have the following tests or measurements: Height, weight, and BMI. Blood pressure. Lipid and cholesterol levels. These may be checked every 5 years, or more frequently if you are over 4 years old. Skin check. Lung cancer screening. You may have this screening every year starting at age 53 if you have a 30-pack-year history of smoking and currently smoke or have quit within the past 15 years. Fecal occult blood test (FOBT) of the stool. You may have this test every year starting at age 71. Flexible sigmoidoscopy or colonoscopy. You may have a sigmoidoscopy every 5 years or a colonoscopy every 10 years starting at age 6. Hepatitis C blood test. Hepatitis B blood test. Sexually transmitted disease (STD) testing. Diabetes screening. This is done by checking your blood sugar (glucose) after you have not eaten for a while (fasting). You may have this done every 1-3 years. Bone density scan. This is done to screen for osteoporosis. You may have this done starting at age 23. Mammogram. This may be done every 1-2 years. Talk to your health care provider about how often  you should have regular mammograms. Talk with your health care provider about your test results, treatment options, and if necessary, the need for more tests. Vaccines  Your health care provider may recommend certain vaccines, such as: Influenza vaccine. This is recommended every year. Tetanus, diphtheria, and acellular pertussis (Tdap, Td) vaccine. You may need a Td booster every 10  years. Zoster vaccine. You may need this after age 71. Pneumococcal 13-valent conjugate (PCV13) vaccine. One dose is recommended after age 58. Pneumococcal polysaccharide (PPSV23) vaccine. One dose is recommended after age 15. Talk to your health care provider about which screenings and vaccines you need and how often you need them. This information is not intended to replace advice given to you by your health care provider. Make sure you discuss any questions you have with your health care provider. Document Released: 06/21/2015 Document Revised: 02/12/2016 Document Reviewed: 03/26/2015 Elsevier Interactive Patient Education  2017 [place] Prevention in the Home Falls can cause injuries. They can happen to people of all ages. There are many things you can do to make your home safe and to help prevent falls. What can I do on the outside of my home? Regularly fix the edges of walkways and driveways and fix any cracks. Remove anything that might make you trip as you walk through a door, such as a raised step or threshold. Trim any bushes or trees on the path to your home. Use bright outdoor lighting. Clear any walking paths of anything that might make someone trip, such as rocks or tools. Regularly check to see if handrails are loose or broken. Make sure that both sides of any steps have handrails. Any raised decks and porches should have guardrails on the edges. Have any leaves, snow, or ice cleared regularly. Use sand or salt on walking paths during winter. Clean up any spills in your garage right away. This includes oil or grease spills. What can I do in the bathroom? Use night lights. Install grab bars by the toilet and in the tub and shower. Do not use towel bars as grab bars. Use non-skid mats or decals in the tub or shower. If you need to sit down in the shower, use a plastic, non-slip stool. Keep the floor dry. Clean up any water that spills on the floor as soon as it  happens. Remove soap buildup in the tub or shower regularly. Attach bath mats securely with double-sided non-slip rug tape. Do not have throw rugs and other things on the floor that can make you trip. What can I do in the bedroom? Use night lights. Make sure that you have a light by your bed that is easy to reach. Do not use any sheets or blankets that are too big for your bed. They should not hang down onto the floor. Have a firm chair that has side arms. You can use this for support while you get dressed. Do not have throw rugs and other things on the floor that can make you trip. What can I do in the kitchen? Clean up any spills right away. Avoid walking on wet floors. Keep items that you use a lot in easy-to-reach places. If you need to reach something above you, use a strong step stool that has a grab bar. Keep electrical cords out of the way. Do not use floor polish or wax that makes floors slippery. If you must use wax, use non-skid floor wax. Do not have throw rugs and other things on  the floor that can make you trip. What can I do with my stairs? Do not leave any items on the stairs. Make sure that there are handrails on both sides of the stairs and use them. Fix handrails that are broken or loose. Make sure that handrails are as long as the stairways. Check any carpeting to make sure that it is firmly attached to the stairs. Fix any carpet that is loose or worn. Avoid having throw rugs at the top or bottom of the stairs. If you do have throw rugs, attach them to the floor with carpet tape. Make sure that you have a light switch at the top of the stairs and the bottom of the stairs. If you do not have them, ask someone to add them for you. What else can I do to help prevent falls? Wear shoes that: Do not have high heels. Have rubber bottoms. Are comfortable and fit you well. Are closed at the toe. Do not wear sandals. If you use a stepladder: Make sure that it is fully opened.  Do not climb a closed stepladder. Make sure that both sides of the stepladder are locked into place. Ask someone to hold it for you, if possible. Clearly mark and make sure that you can see: Any grab bars or handrails. First and last steps. Where the edge of each step is. Use tools that help you move around (mobility aids) if they are needed. These include: Canes. Walkers. Scooters. Crutches. Turn on the lights when you go into a dark area. Replace any light bulbs as soon as they burn out. Set up your furniture so you have a clear path. Avoid moving your furniture around. If any of your floors are uneven, fix them. If there are any pets around you, be aware of where they are. Review your medicines with your doctor. Some medicines can make you feel dizzy. This can increase your chance of falling. Ask your doctor what other things that you can do to help prevent falls. This information is not intended to replace advice given to you by your health care provider. Make sure you discuss any questions you have with your health care provider. Document Released: 03/21/2009 Document Revised: 10/31/2015 Document Reviewed: 06/29/2014 Elsevier Interactive Patient Education  2017 [employer].

## 2022-04-08 NOTE — Progress Notes (Signed)
Subjective:   Beth Cantu is a 88 y.o. female who presents for Medicare Annual (Subsequent) preventive examination.  Review of Systems    Virtual Visit via Telephone Note  I connected with  Beth Cantu on 04/08/22 at  1:00 PM EDT by telephone and verified that I am speaking with the correct person using two identifiers.  Location: Patient: Home Provider: Office Persons participating in the virtual visit: patient/Nurse Health Advisor   I discussed the limitations, risks, security and privacy concerns of performing an evaluation and management service by telephone and the availability of in person appointments. The patient expressed understanding and agreed to proceed.  Interactive audio and video telecommunications were attempted between this nurse and patient, however failed, due to patient having technical difficulties OR patient did not have access to video capability.  We continued and completed visit with audio only.  Some vital signs may be absent or patient reported.   Criselda Peaches, LPN  Cardiac Risk Factors include: advanced age (>60men, >41 women)     Objective:    Today's Vitals   04/08/22 1305  Weight: 161 lb (73 kg)  Height: 5\' 4"  (1.626 m)   Body mass index is 27.64 kg/m.     04/08/2022    1:13 PM 12/22/2021    3:29 PM 12/22/2021    1:20 PM 12/22/2021    4:38 AM 04/04/2021   10:39 AM 10/06/2017    7:57 PM  Advanced Directives  Does Patient Have a Medical Advance Directive? Yes Yes Yes No Yes Yes  Type of Paramedic of [place];Living will [place];Living will [place];Living will  [place];Living will [place];Living will  Does patient want to make changes to medical advance directive?  No - Patient declined      Copy of [place] in Chart? No - copy requested No - copy requested No - copy requested  No - copy requested   Would patient  like information on creating a medical advance directive?  No - Patient declined  No - Patient declined      Current Medications (verified) Outpatient Encounter Medications as of 04/08/2022  Medication Sig   augmented betamethasone dipropionate (DIPROLENE-AF) 0.05 % cream Apply topically 2 (two) times daily as needed.   Cyanocobalamin (VITAMIN B-12 PO) Take 1 tablet by mouth daily.   Ergocalciferol (VITAMIN D2) 400 units TABS Take 1 tablet by mouth daily.   Multiple Vitamins-Minerals (PRESERVISION AREDS 2 PO) Take 1 tablet by mouth daily.   nystatin cream (MYCOSTATIN) Apply 1 Application topically 2 (two) times daily as needed for dry skin.   ondansetron (ZOFRAN) 4 MG tablet Take 1 tablet (4 mg total) by mouth every 6 (six) hours as needed for nausea. (Patient not taking: Reported on 03/27/2022)   pantoprazole (PROTONIX) 40 MG tablet Take 1 tablet (40 mg total) by mouth 2 (two) times daily before a meal.   No facility-administered encounter medications on file as of 04/08/2022.    Allergies (verified) Aspirin and Erythromycin   History: Past Medical History:  Diagnosis Date   Back pain    Hyperlipidemia    Varicose veins    Past Surgical History:  Procedure Laterality Date   BIOPSY  12/22/2021   Procedure: BIOPSY;  Surgeon: Irving Copas., MD;  Location: [facility];  Service: Gastroenterology;;   ESOPHAGOGASTRODUODENOSCOPY (EGD) WITH PROPOFOL N/A 12/22/2021   Procedure: ESOPHAGOGASTRODUODENOSCOPY (EGD) WITH PROPOFOL;  Surgeon:  Mansouraty, Netty Starring., MD;  Location: [facility];  Service: Gastroenterology;  Laterality: N/A;   Family History  Problem Relation Age of Onset   Cancer Father        bladder   Colon cancer Neg Hx    Esophageal cancer Neg Hx    Inflammatory bowel disease Neg Hx    Liver disease Neg Hx    Pancreatic cancer Neg Hx    Rectal cancer Neg Hx    Stomach cancer Neg Hx    Social History   Socioeconomic History   Marital status: Widowed     Spouse name: Not on file   Number of children: Not on file   Years of education: Not on file   Highest education level: Not on file  Occupational History   Not on file  Tobacco Use   Smoking status: Never   Smokeless tobacco: Never  Vaping Use   Vaping Use: Never used  Substance and Sexual Activity   Alcohol use: No    Alcohol/week: 0.0 standard drinks of alcohol   Drug use: No   Sexual activity: Not Currently  Other Topics Concern   Not on file  Social History Narrative   Not on file   Social Determinants of Health   Financial Resource Strain: Low Risk  (04/08/2022)   Overall Financial Resource Strain (CARDIA)    Difficulty of Paying Living Expenses: Not hard at all  Food Insecurity: No Food Insecurity (04/08/2022)   Hunger Vital Sign    Worried About Running Out of Food in the Last Year: Never true    Ran Out of Food in the Last Year: Never true  Transportation Needs: No Transportation Needs (04/08/2022)   PRAPARE - Administrator, Civil Service (Medical): No    Lack of Transportation (Non-Medical): No  Physical Activity: Inactive (04/08/2022)   Exercise Vital Sign    Days of Exercise per Week: 0 days    Minutes of Exercise per Session: 0 min  Stress: No Stress Concern Present (04/08/2022)   [employer] of Occupational Health - Occupational Stress Questionnaire    Feeling of Stress : Not at all  Social Connections: Moderately Integrated (04/08/2022)   Social Connection and Isolation Panel [NHANES]    Frequency of Communication with Friends and Family: More than three times a week    Frequency of Social Gatherings with Friends and Family: More than three times a week    Attends Religious Services: More than 4 times per year    Active Member of [employer] or Organizations: Yes    Attends Banker Meetings: More than 4 times per year    Marital Status: Widowed    Tobacco Counseling Counseling given: Not Answered   Clinical Intake:  Pre-visit  preparation completed: No  Pain : No/denies pain     BMI - recorded: 27.64 Nutritional Status: BMI 25 -29 Overweight Nutritional Risks: None Diabetes: No  How often do you need to have someone help you when you read instructions, pamphlets, or other written materials from your doctor or pharmacy?: 1 - Never  Diabetic?  No  Interpreter Needed?: No  Information entered by :: Beth Mulligan LPN   Activities of Daily Living    04/08/2022    1:11 PM 12/22/2021    3:33 PM  In your present state of health, do you have any difficulty performing the following activities:  Hearing? 0   Vision? 0   Difficulty concentrating or making decisions? 0  Walking or climbing stairs? 0   Dressing or bathing? 0   Doing errands, shopping? 0 1  Preparing Food and eating ? N   Using the Toilet? N   In the past six months, have you accidently leaked urine? N   Do you have problems with loss of bowel control? N   Managing your Medications? N   Managing your Finances? N   Housekeeping or managing your Housekeeping? N     Patient Care Team: [place], Betty G, MD as PCP - General (Family Medicine) Lyn Records, MD as PCP - Cardiology (Cardiology)  Indicate any recent Medical Services you may have received from other than [facility] providers in the past year (date may be approximate).     Assessment:   This is a routine wellness examination for Beth Cantu.  Hearing/Vision screen Hearing Screening - Comments:: Denies hearing difficulties   Vision Screening - Comments:: Wears rx glasses - up to date with routine eye exams with  Dr Lita Mains  Dietary issues and exercise activities discussed: Current Exercise Habits: The patient does not participate in regular exercise at present, Exercise limited by: None identified   Goals Addressed               This Visit's Progress     No current goals (pt-stated)         Depression Screen    04/08/2022    1:11 PM 12/03/2021   10:39 AM 04/04/2021   10:40  AM 04/04/2021   10:37 AM 10/30/2020   11:59 PM 11/04/2019    1:49 PM 09/13/2017   10:31 AM  PHQ 2/9 Scores  PHQ - 2 Score 0 1 0 0 0 2 0  PHQ- 9 Score  7    11     Fall Risk    04/08/2022    1:12 PM 12/03/2021   10:39 AM 04/04/2021   10:39 AM 10/30/2020   11:59 PM 10/31/2019   10:22 AM  Fall Risk   Falls in the past year? 0 0 0 1 0  Number falls in past yr: 0 0 0 0 0  Injury with Fall? 0 0 0 0 0  Risk for fall due to : No Fall Risks  No Fall Risks  Orthopedic patient  Follow up Falls prevention discussed Falls evaluation completed Falls evaluation completed Falls prevention discussed;Education provided Education provided    FALL RISK PREVENTION PERTAINING TO THE HOME:  Any stairs in or around the home? Yes  If so, are there any without handrails? Yes  Home free of loose throw rugs in walkways, pet beds, electrical cords, etc? Yes  Adequate lighting in your home to reduce risk of falls? Yes   ASSISTIVE DEVICES UTILIZED TO PREVENT FALLS:  Life alert? No  Use of a cane, walker or w/c? Yes  Grab bars in the bathroom? Yes  Shower chair or bench in shower? Yes  Elevated toilet seat or a handicapped toilet? Yes   TIMED UP AND GO:  Was the test performed? No . Audio Visit   Cognitive Function:        04/08/2022    1:13 PM  6CIT Screen  What Year? 0 points  What month? 0 points  What time? 0 points  Count back from 20 0 points  Months in reverse 0 points  Repeat phrase 0 points  Total Score 0 points    Immunizations Immunization History  Administered Date(s) Administered   Influenza, High Dose Seasonal PF 04/14/2016  PFIZER(Purple Top)SARS-COV-2 Vaccination 06/28/2019, 07/16/2019, 04/06/2020   Pneumococcal Conjugate-13 02/05/2015   Pneumococcal Polysaccharide-23 02/25/2009      Flu Vaccine status: Due, Education has been provided regarding the importance of this vaccine. Advised may receive this vaccine at local pharmacy or Health Dept. Aware to provide a copy of  the vaccination record if obtained from local pharmacy or Health Dept. Verbalized acceptance and understanding.  Pneumococcal vaccine status: Up to date  Covid-19 vaccine status: Completed vaccines  Qualifies for Shingles Vaccine? Yes   Zostavax completed No   Shingrix Completed?: No.    Education has been provided regarding the importance of this vaccine. Patient has been advised to call insurance company to determine out of pocket expense if they have not yet received this vaccine. Advised may also receive vaccine at local pharmacy or Health Dept. Verbalized acceptance and understanding.  Screening Tests Health Maintenance  Topic Date Due   COVID-19 Vaccine (4 - Pfizer risk series) 06/12/2022 (Originally 06/01/2020)   Zoster Vaccines- Shingrix (1 of 2) 06/12/2022 (Originally 02/19/1944)   INFLUENZA VACCINE  09/06/2022 (Originally 01/06/2022)   Medicare Annual Wellness (AWV)  04/09/2023   Pneumonia Vaccine 78+ Years old  Completed   HPV VACCINES  Aged Out   DEXA SCAN  Discontinued   TETANUS/TDAP  Discontinued    Health Maintenance  There are no preventive care reminders to display for this patient.   Colorectal cancer screening: No longer required.   Mammogram status: No longer required due to Age.    Lung Cancer Screening: (Low Dose CT Chest recommended if Age 52-80 years, 30 pack-year currently smoking OR have quit w/in 15years.) does not qualify.     Additional Screening:  Hepatitis C Screening: does not qualify; Completed    Vision Screening: Recommended annual ophthalmology exams for early detection of glaucoma and other disorders of the eye. Is the patient up to date with their annual eye exam?  Yes  Who is the provider or what is the name of the office in which the patient attends annual eye exams? Dr Lita Mains If pt is not established with a provider, would they like to be referred to a provider to establish care? No .   Dental Screening: Recommended annual dental  exams for proper oral hygiene  Community Resource Referral / Chronic Care Management:  CRR required this visit?  No   CCM required this visit?  No      Plan:     I have personally reviewed and noted the following in the patient's chart:   Medical and social history Use of alcohol, tobacco or illicit drugs  Current medications and supplements including opioid prescriptions. Patient is not currently taking opioid prescriptions. Functional ability and status Nutritional status Physical activity Advanced directives List of other physicians Hospitalizations, surgeries, and ER visits in previous 12 months Vitals Screenings to include cognitive, depression, and falls Referrals and appointments  In addition, I have reviewed and discussed with patient certain preventive protocols, quality metrics, and best practice recommendations. A written personalized care plan for preventive services as well as general preventive health recommendations were provided to patient.     Tillie Rung, LPN   01/0/2725   Nurse Notes: None

## 2022-06-22 ENCOUNTER — Other Ambulatory Visit: Payer: Self-pay | Admitting: Gastroenterology

## 2022-12-17 ENCOUNTER — Other Ambulatory Visit: Payer: Self-pay | Admitting: Family Medicine

## 2022-12-17 NOTE — Telephone Encounter (Signed)
Prescription Request  12/17/2022  LOV: 12/31/2021  What is the name of the medication or equipment? ondansetron (ZOFRAN) 4 MG tablet . Pt has cpe sch for 01-27-2023   Have you contacted your pharmacy to request a refill? No   Which pharmacy would you like this sent to?  [id number] [place], [place] - [address] [place] [address] Phone: [phone] Fax: [phone]    Patient notified that their request is being sent to the clinical staff for review and that they should receive a response within 2 business days.   Please advise at [facility] [phone]

## 2023-01-26 NOTE — Progress Notes (Signed)
HPI: Beth Cantu is a 89 y.o. female, who is here today with her daughter for her routine physical and to address some concerns.  Last CPE: 12/03/21  She consumes a healthy diet, including garden salads, salmon, baked potatoes, and whole wheat toast. She also eats meat and gets enough protein. She mentions that she has been out of milk for about a month, which she usually consumes with cereal for protein.  Immunization History  Administered Date(s) Administered   COVID-19, mRNA, vaccine(Comirnaty)12 years and older 04/06/2022   Influenza, High Dose Seasonal PF 04/14/2016   PFIZER(Purple Top)SARS-COV-2 Vaccination 06/28/2019, 07/16/2019, 04/06/2020   Pneumococcal Conjugate-13 02/05/2015   Pneumococcal Polysaccharide-23 02/25/2009   Health Maintenance  Topic Date Due   DTaP/Tdap/Td (1 - Tdap) Never done   Zoster Vaccines- Shingrix (1 of 2) Never done   INFLUENZA VACCINE  02/03/2023 (Originally 01/07/2023)   COVID-19 Vaccine (5 - 2023-24 season) 02/12/2023 (Originally 06/01/2022)   Medicare Annual Wellness (AWV)  04/09/2023   Pneumonia Vaccine 45+ Years old  Completed   HPV VACCINES  Aged Out   DEXA SCAN  Discontinued   She reports recent GERD, history of hiatal hernia and heartburn, has made some dietary changes. EGD on 12/23/22.  Her daughter is concerned about weight loss, going from 161 lbs to 144 lbs, which she attributes to" eating carefully" to prevent GERD symptoms. Her daughter reports GI acute illness in 10/2022 and lost most of wt during this time, did not gain it back but has not lost more wt since then. Her daughter believes GI illness was due to norovirus.  She mentions that she gets extremely ill and vomits if she eats too much.  Upper GI bleed in 12/2021.  She is on Pantoprazole 40 mg bid. She is requesting refills for Zofran, to have it in case she develops symptoms.  She has not had any episodes of nausea or vomiting since May.   She complains of significant mid  and low back pain, chronic. She avoids taking medications until the pain is severe and uses Tylenol for pain relief.   Her daughter is also concerned about fatigue and poor sleep hygiene. We have discussed these problems in the past. She continues eating in her room, not eating in the dinner room, afraid of sick contact.  Sleeps during the day and stays up most of the night. Nocturia also interferes with sleep, chronic. Denies dysuria,gross hematuria,or increased urinary frequency.  Her daughter would like to have vitamins and protein check,concerned about not getting enough protein. Albumin has been mildly low, 3.0 (3.1). Lab Results  Component Value Date   ALT 17 12/23/2021   AST 19 12/23/2021   ALKPHOS 44 12/23/2021   BILITOT 1.2 12/23/2021    She would like cholesterol checked. Aortic atherosclerosis, she is not on statin. Last visit, noted soft heart murmur, she requested cardiology evaluation. Evaluated by cardiologist, Dr Katrinka Blazing, 12/27/22, no further follow up was recommended.  Lab Results  Component Value Date   CHOL 183 12/03/2021   HDL 76.40 12/03/2021   LDLCALC 95 12/03/2021   TRIG 59.0 12/03/2021   CHOLHDL 2 12/03/2021   Review of Systems  Constitutional:  Positive for fatigue. Negative for activity change and fever.  HENT:  Negative for mouth sores, sore throat and trouble swallowing.   Eyes:  Negative for redness and visual disturbance.  Respiratory:  Negative for cough, shortness of breath and wheezing.   Cardiovascular:  Positive for leg swelling (chronic). Negative for chest  pain and palpitations.  Gastrointestinal:  Negative for abdominal pain, nausea and vomiting.       No changes in bowel habits.  Endocrine: Negative for cold intolerance, heat intolerance, polydipsia, polyphagia and polyuria.  Genitourinary:  Negative for decreased urine volume, dysuria and hematuria.  Musculoskeletal:  Positive for arthralgias, back pain and gait problem.  Skin:  Negative  for color change and rash.  Allergic/Immunologic: Negative for environmental allergies.  Neurological:  Negative for syncope and headaches.  Psychiatric/Behavioral:  Negative for confusion and hallucinations.   All other systems reviewed and are negative.  Current Outpatient Medications on File Prior to Visit  Medication Sig Dispense Refill   augmented betamethasone dipropionate (DIPROLENE-AF) 0.05 % cream Apply topically 2 (two) times daily as needed.     Cyanocobalamin (VITAMIN B-12 PO) Take 1 tablet by mouth daily.     Ergocalciferol (VITAMIN D2) 400 units TABS Take 1 tablet by mouth daily.     Multiple Vitamins-Minerals (PRESERVISION AREDS 2 PO) Take 1 tablet by mouth daily.     nystatin cream (MYCOSTATIN) Apply 1 Application topically 2 (two) times daily as needed for dry skin. 60 g 4   pantoprazole (PROTONIX) 40 MG tablet TAKE 1 TABLET (40 MG TOTAL) BY MOUTH TWICE A DAY BEFORE MEALS 180 tablet 1   No current facility-administered medications on file prior to visit.   Past Medical History:  Diagnosis Date   Back pain    Hyperlipidemia    Varicose veins    Past Surgical History:  Procedure Laterality Date   BIOPSY  12/22/2021   Procedure: BIOPSY;  Surgeon: Meridee Score Netty Starring., MD;  Location: [facility];  Service: Gastroenterology;;   ESOPHAGOGASTRODUODENOSCOPY (EGD) WITH PROPOFOL N/A 12/22/2021   Procedure: ESOPHAGOGASTRODUODENOSCOPY (EGD) WITH PROPOFOL;  Surgeon: Lemar Lofty., MD;  Location: [facility];  Service: Gastroenterology;  Laterality: N/A;   Allergies  Allergen Reactions   Aspirin     Other reaction(s): Other (See Comments) Other Reaction: GI UPSET   Erythromycin     Other reaction(s): Other (See Comments) Makes her sick    Family History  Problem Relation Age of Onset   Cancer Father        bladder   Colon cancer Neg Hx    Esophageal cancer Neg Hx    Inflammatory bowel disease Neg Hx    Liver disease Neg Hx    Pancreatic cancer Neg Hx     Rectal cancer Neg Hx    Stomach cancer Neg Hx     Social History   Socioeconomic History   Marital status: Widowed    Spouse name: Not on file   Number of children: Not on file   Years of education: Not on file   Highest education level: Not on file  Occupational History   Not on file  Tobacco Use   Smoking status: Never   Smokeless tobacco: Never  Vaping Use   Vaping status: Never Used  Substance and Sexual Activity   Alcohol use: No    Alcohol/week: 0.0 standard drinks of alcohol   Drug use: No   Sexual activity: Not Currently  Other Topics Concern   Not on file  Social History Narrative   Not on file   Social Determinants of Health   Financial Resource Strain: Low Risk  (04/08/2022)   Overall Financial Resource Strain (CARDIA)    Difficulty of Paying Living Expenses: Not hard at all  Food Insecurity: No Food Insecurity (04/08/2022)   Hunger Vital Sign  Worried About Programme researcher, broadcasting/film/video in the Last Year: Never true    Ran Out of Food in the Last Year: Never true  Transportation Needs: No Transportation Needs (04/08/2022)   PRAPARE - Administrator, Civil Service (Medical): No    Lack of Transportation (Non-Medical): No  Physical Activity: Inactive (04/08/2022)   Exercise Vital Sign    Days of Exercise per Week: 0 days    Minutes of Exercise per Session: 0 min  Stress: No Stress Concern Present (04/08/2022)   [employer] of Occupational Health - Occupational Stress Questionnaire    Feeling of Stress : Not at all  Social Connections: Moderately Integrated (04/08/2022)   Social Connection and Isolation Panel [NHANES]    Frequency of Communication with Friends and Family: More than three times a week    Frequency of Social Gatherings with Friends and Family: More than three times a week    Attends Religious Services: More than 4 times per year    Active Member of [employer] or Organizations: Yes    Attends Banker Meetings: More than 4  times per year    Marital Status: Widowed    Vitals:   01/27/23 1039  BP: 118/78  Pulse: 77  Resp: 16  Temp: 97.7 F (36.5 C)  SpO2: 96%   Body mass index is 25.26 kg/m.  Wt Readings from Last 3 Encounters:  01/27/23 144 lb 6 oz (65.5 kg)  04/08/22 161 lb (73 kg)  03/27/22 161 lb (73 kg)   Physical Exam Vitals and nursing note reviewed.  Constitutional:      General: She is not in acute distress.    Appearance: She is well-developed.  HENT:     Head: Normocephalic and atraumatic.     Right Ear: Tympanic membrane, ear canal and external ear normal.     Left Ear: Tympanic membrane, ear canal and external ear normal.     Mouth/Throat:     Mouth: Mucous membranes are moist.     Pharynx: Oropharynx is clear.  Eyes:     Conjunctiva/sclera: Conjunctivae normal.     Pupils: Pupils are equal, round, and reactive to light.  Cardiovascular:     Rate and Rhythm: Normal rate and regular rhythm. Occasional Extrasystoles are present.    Heart sounds: Murmur (SEM I/VI RUSB and LUSB) heard.     Comments: Trace pitting LE edema. PT pulses present. LLE lymphedema. Pulmonary:     Effort: Pulmonary effort is normal. No respiratory distress.     Breath sounds: Normal breath sounds.  Abdominal:     Palpations: Abdomen is soft. There is no mass.     Tenderness: There is no abdominal tenderness.  Musculoskeletal:     Cervical back: No tenderness.     Thoracic back: Tenderness present.     Lumbar back: Tenderness present.     Right lower leg: Edema present.     Left lower leg: Edema present.  Lymphadenopathy:     Cervical: No cervical adenopathy.  Skin:    General: Skin is warm.     Findings: No erythema or rash.  Neurological:     General: No focal deficit present.     Mental Status: She is alert and oriented to person, place, and time.     Cranial Nerves: No cranial nerve deficit.     Comments: Mildly unstable , antalgic gait associated with a cane.  Psychiatric:        Mood  and Affect:  Mood and affect normal.   ASSESSMENT AND PLAN: Ms. AAYAT BOCKOVER was here today annual physical examination.  Orders Placed This Encounter  Procedures   Comprehensive metabolic panel   Lipid panel   Vitamin B12   VITAMIN D 25 Hydroxy (Vit-D Deficiency, Fractures)   TSH   Ambulatory referral to Physical Therapy   Ambulatory referral to Home Health   Routine general medical examination at a health care facility We discussed the importance of regular physical activity and healthy diet for prevention of chronic illness and/or complications. Preventive guidelines reviewed. Vaccination up to date. Ca++ and vit D supplementation. Fall precautions. Next CPE in a year.  Pure hypercholesterolemia Assessment & Plan: She would like to continue monitoring numbers. Continue non pharmacologic treatment.  Orders: -     Comprehensive metabolic panel; Future -     Lipid panel; Future -     TSH; Future  Atherosclerosis of aorta [facility]) Assessment & Plan: Seen on Abdominal CT in 12/2021. She is not on statin medication.  Orders: -     Lipid panel; Future  Patient request for diagnostic testing -     Vitamin B12; Future -     VITAMIN D 25 Hydroxy (Vit-D Deficiency, Fractures); Future  Fatigue, unspecified type Assessment & Plan: Chronic. We discussed possible etiologies. A better sleep pattern will help. Age can also be a contributing factor. No further work up recommended at this time.  Orders: -     TSH; Future  Chronic right-sided back pain, unspecified back location Assessment & Plan: Chronic. She agrees with trying PT, prefers it at home, [facility] referral placed. Topical icy hot or aspect cream or OTC lidocaine/icy hot patch recommended. Continue Tylenol 500 mg, 3-4 tab max per day.  Orders: -     Ambulatory referral to Physical Therapy -     Ambulatory referral to Home Health  Nausea and vomiting, unspecified vomiting type Currently asymptomatic. Had N/V during  acute GI illness. I sent rx for Zofran as requested but if she develops symptoms she needs to let her daughter know and arrange appt with GI if persistent.  -     Ondansetron HCl; Take 1 tablet (4 mg total) by mouth daily as needed for nausea.  Dispense: 10 tablet; Refill: 1  Mild protein malnutrition (HCC) Assessment & Plan: Albumin 3.0 in 12/2021. We discussed the importance of appropriate protein intake. She can add a booster/protein shake daily.  Gastroesophageal reflux disease with esophagitis without hemorrhage Assessment & Plan: Large hiatal hernia. S/P EGD 12/2022. Currently on Pantoprazole 40 mg bid. Continue GERD precautions. According to Dr Meridee Score, she is to follow annually.  Return in 1 year (on 01/27/2024) for CPE, chronic problems.  Saron Tweed G. [place], MD  [facility]. [place] office.

## 2023-01-27 ENCOUNTER — Ambulatory Visit (INDEPENDENT_AMBULATORY_CARE_PROVIDER_SITE_OTHER): Payer: Medicare HMO | Admitting: Family Medicine

## 2023-01-27 ENCOUNTER — Encounter: Payer: Self-pay | Admitting: Family Medicine

## 2023-01-27 VITALS — BP 118/78 | HR 77 | Temp 97.7°F | Resp 16 | Ht 63.39 in | Wt 144.4 lb

## 2023-01-27 DIAGNOSIS — M549 Dorsalgia, unspecified: Secondary | ICD-10-CM | POA: Diagnosis not present

## 2023-01-27 DIAGNOSIS — R5383 Other fatigue: Secondary | ICD-10-CM | POA: Diagnosis not present

## 2023-01-27 DIAGNOSIS — Z0189 Encounter for other specified special examinations: Secondary | ICD-10-CM

## 2023-01-27 DIAGNOSIS — Z0000 Encounter for general adult medical examination without abnormal findings: Secondary | ICD-10-CM

## 2023-01-27 DIAGNOSIS — I700 Atherosclerosis of aorta: Secondary | ICD-10-CM

## 2023-01-27 DIAGNOSIS — E441 Mild protein-calorie malnutrition: Secondary | ICD-10-CM

## 2023-01-27 DIAGNOSIS — K2100 Gastro-esophageal reflux disease with esophagitis, without bleeding: Secondary | ICD-10-CM

## 2023-01-27 DIAGNOSIS — G8929 Other chronic pain: Secondary | ICD-10-CM

## 2023-01-27 DIAGNOSIS — E7800 Pure hypercholesterolemia, unspecified: Secondary | ICD-10-CM

## 2023-01-27 DIAGNOSIS — R112 Nausea with vomiting, unspecified: Secondary | ICD-10-CM

## 2023-01-27 LAB — COMPREHENSIVE METABOLIC PANEL
ALT: 12 U/L (ref 0–35)
AST: 20 U/L (ref 0–37)
Albumin: 4.1 g/dL (ref 3.5–5.2)
Alkaline Phosphatase: 67 U/L (ref 39–117)
BUN: 19 mg/dL (ref 6–23)
CO2: 30 meq/L (ref 19–32)
Calcium: 9.7 mg/dL (ref 8.4–10.5)
Chloride: 100 meq/L (ref 96–112)
Creatinine, Ser: 0.64 mg/dL (ref 0.40–1.20)
GFR: 74.85 mL/min (ref >60.00)
Glucose, Bld: 116 mg/dL — ABNORMAL HIGH (ref 70–99)
Potassium: 4.5 meq/L (ref 3.5–5.1)
Sodium: 138 meq/L (ref 135–145)
Total Bilirubin: 1 mg/dL (ref 0.2–1.2)
Total Protein: 7 g/dL (ref 6.0–8.3)

## 2023-01-27 LAB — LIPID PANEL
Cholesterol: 214 mg/dL — ABNORMAL HIGH (ref 0–200)
HDL: 63.4 mg/dL (ref >39.00)
LDL Cholesterol: 132 mg/dL — ABNORMAL HIGH (ref 0–99)
NonHDL: 150.37
Total CHOL/HDL Ratio: 3
Triglycerides: 91 mg/dL (ref 0.0–149.0)
VLDL: 18.2 mg/dL (ref 0.0–40.0)

## 2023-01-27 MED ORDER — ONDANSETRON HCL 4 MG PO TABS: 4.0000 mg | ORAL_TABLET | Freq: Every day | ORAL | 1 refills | Status: DC | PRN

## 2023-01-27 NOTE — Patient Instructions (Addendum)
A few things to remember from today's visit:  Routine general medical examination at a health care facility  Pure hypercholesterolemia - Plan: Comprehensive metabolic panel, Lipid panel, TSH  Atherosclerosis of aorta (HCC) - Plan: Lipid panel  Patient request for diagnostic testing - Plan: Vitamin B12, VITAMIN D 25 Hydroxy (Vit-D Deficiency, Fractures)  Fatigue, unspecified type - Plan: TSH Zofran sent to use if needed but let me know if you do.  Do not use My Chart to request refills or for acute issues that need immediate attention. If you send a my chart message, it may take a few days to be addressed, specially if I am not in the office.  Please be sure medication list is accurate. If a new problem present, please set up appointment sooner than planned today.

## 2023-01-28 NOTE — Assessment & Plan Note (Signed)
Seen on Abdominal CT in 12/2021. She is not on statin medication.

## 2023-01-28 NOTE — Assessment & Plan Note (Signed)
Large hiatal hernia. S/P EGD 12/2022. Currently on Pantoprazole 40 mg bid. Continue GERD precautions. According to Dr Meridee Score, she is to follow annually.

## 2023-01-28 NOTE — Assessment & Plan Note (Signed)
She would like to continue monitoring numbers. Continue non pharmacologic treatment.

## 2023-01-28 NOTE — Assessment & Plan Note (Addendum)
Chronic. We discussed possible etiologies. A better sleep pattern will help. Age can also be a contributing factor. No further work up recommended at this time.

## 2023-01-28 NOTE — Assessment & Plan Note (Signed)
Chronic. She agrees with trying PT, prefers it at home, [facility] referral placed. Topical icy hot or aspect cream or OTC lidocaine/icy hot patch recommended. Continue Tylenol 500 mg, 3-4 tab max per day.

## 2023-01-28 NOTE — Assessment & Plan Note (Addendum)
Albumin 3.0 in 12/2021. We discussed the importance of appropriate protein intake. She can add a booster/protein shake daily.

## 2023-01-29 LAB — TSH: TSH: 1.57 u[IU]/mL (ref 0.35–5.50)

## 2023-01-29 LAB — VITAMIN B12: Vitamin B-12: 413 pg/mL (ref 211–911)

## 2023-01-29 LAB — VITAMIN D 25 HYDROXY (VIT D DEFICIENCY, FRACTURES): VITD: 57.36 ng/mL (ref 30.00–100.00)

## 2023-02-04 ENCOUNTER — Telehealth: Payer: Self-pay | Admitting: Family Medicine

## 2023-02-04 NOTE — Telephone Encounter (Signed)
Lia - PT with Rolene Arbour [phone] *Ok to leave a detailed message on this line.  Verbal Order - PT  1x a wk for 6 wks  Also, Can she use codes for Thoracic and Lumbar Osteoarthritis and Kyphosis ?  Lia informed MD is OOO today.

## 2023-02-05 NOTE — Telephone Encounter (Signed)
It is okay to give verbal authorization for requested services. Thanks, BJ 

## 2023-02-05 NOTE — Telephone Encounter (Signed)
Spoke to [employer]. Given her okay for verbal order. Verbalized understanding.

## 2023-03-05 ENCOUNTER — Telehealth: Payer: Self-pay | Admitting: Family Medicine

## 2023-03-05 NOTE — Telephone Encounter (Signed)
Requesting copy of most recent lab results

## 2023-03-05 NOTE — Telephone Encounter (Signed)
Placed in the bin to to be mailed out to patient.

## 2023-04-13 ENCOUNTER — Encounter (INDEPENDENT_AMBULATORY_CARE_PROVIDER_SITE_OTHER): Payer: Medicare HMO | Admitting: Family Medicine

## 2023-04-13 NOTE — Progress Notes (Signed)
Error. Per staff  contacted 3 times an not available for appt.

## 2023-04-23 ENCOUNTER — Other Ambulatory Visit: Payer: Self-pay | Admitting: Gastroenterology

## 2023-04-27 ENCOUNTER — Telehealth: Payer: Self-pay | Admitting: Family Medicine

## 2023-04-27 DIAGNOSIS — L304 Erythema intertrigo: Secondary | ICD-10-CM

## 2023-04-27 NOTE — Telephone Encounter (Signed)
Prescription Request  04/27/2023  LOV: 01/27/2023  What is the name of the medication or equipment?  nystatin cream (MYCOSTATIN)   Have you contacted your pharmacy to request a refill? No   Which pharmacy would you like this sent to?  [id number] [place], [place] - [address] Phone: [phone]  Fax: [phone]      Significant History/Details   Patient notified that their request is being sent to the clinical staff for review and that they should receive a response within 2 business days.   Please advise at [facility] [phone]

## 2023-04-30 MED ORDER — NYSTATIN 100000 UNIT/GM EX CREA: 1.0000 | TOPICAL_CREAM | Freq: Two times a day (BID) | CUTANEOUS | 4 refills | Status: DC | PRN

## 2023-06-07 ENCOUNTER — Other Ambulatory Visit: Payer: Self-pay | Admitting: Gastroenterology

## 2023-10-26 ENCOUNTER — Encounter (INDEPENDENT_AMBULATORY_CARE_PROVIDER_SITE_OTHER): Payer: Medicare HMO | Admitting: Family Medicine

## 2023-10-26 NOTE — Progress Notes (Signed)
 error

## 2023-11-02 ENCOUNTER — Ambulatory Visit: Admitting: Podiatry

## 2023-11-02 ENCOUNTER — Encounter: Payer: Self-pay | Admitting: Podiatry

## 2023-11-02 DIAGNOSIS — M79672 Pain in left foot: Secondary | ICD-10-CM | POA: Diagnosis not present

## 2023-11-02 DIAGNOSIS — B351 Tinea unguium: Secondary | ICD-10-CM | POA: Diagnosis not present

## 2023-11-02 DIAGNOSIS — M79671 Pain in right foot: Secondary | ICD-10-CM

## 2023-11-02 NOTE — Progress Notes (Signed)
 Patient presents for evaluation and treatment of tenderness and some redness around nails feet.  Tenderness around toes with walking and wearing shoes.  Physical exam:  General appearance: Alert, pleasant, and in no acute distress.  Vascular: Pedal pulses: DP diminished bilaterally, PT nonpalpable bilaterally.  Severe edema lower legs bilaterally  Neurological:  No paresthesias or burning noted.  Dermatologic:  Nails thickened, disfigured, discolored 1-5 BL with subungual debris.  Redness and hypertrophic nail folds along nail folds bilaterally but no signs of drainage or infection.  Musculoskeletal:  Hammertoe deformities 2 through 5 bilaterally   Diagnosis: 1. Painful onychomycotic nails 1 through 5 bilaterally. 2. Pain toes 1 through 5 bilaterally.  Plan: Debrided onychomycotic nails 1 through 5 bilaterally.  Return 3 months

## 2023-11-04 ENCOUNTER — Ambulatory Visit: Admitting: Family Medicine

## 2023-11-04 ENCOUNTER — Encounter: Payer: Self-pay | Admitting: Family Medicine

## 2023-11-04 DIAGNOSIS — Z0000 Encounter for general adult medical examination without abnormal findings: Secondary | ICD-10-CM

## 2023-11-04 NOTE — Progress Notes (Signed)
 Patient unable to obtain vital signs due to telehealth visit

## 2023-11-04 NOTE — Progress Notes (Signed)
 PATIENT CHECK-IN and HEALTH RISK ASSESSMENT QUESTIONNAIRE:  -completed by phone/video for upcoming Medicare Preventive Visit    Pre-Visit Check-in: 1)Vitals (height, wt, BP, etc) - record in vitals section for visit on day of visit Request home vitals (wt, BP, etc.) and enter into vitals, THEN update Vital Signs SmartPhrase below at the top of the HPI. See below.  2)Review and Update Medications, Allergies PMH, Surgeries, Social history in Epic 3)Hospitalizations in the last year with date/reason? no  4)Review and Update Care Team (patient's specialists) in Epic 5) Complete PHQ9 in Epic  6) Complete Fall Screening in Epic 7)Review all Health Maintenance Due and order under PCP if not done.  Medicare Wellness Patient Questionnaire:  Answer theses question about your habits: How often do you have a drink containing alcohol?N/A How many drinks containing alcohol do you have on a typical day when you are drinking?N/A How often do you have six or more drinks on one occasion?N/A Have you ever smoked?No Quit date if applicable? N/A  How many packs a day do/did you smoke? N/A Do you use smokeless tobacco? No Do you use an illicit drugs?No On average, how many days per week do you engage in moderate to strenuous exercise (like a brisk walk)?N/A, reports is pretty good on feet, uses cane On average, how many minutes do you engage in exercise at this level?N/A Are you sexually active? N/A Typical breakfast: Varies - eats meals that are provided at assisted living, reports gets protein and veggies with meals Typical lunch: Varies  Typical dinner: Varies  Typical snacks: Fruit   Beverages: Varies  Daughter helps with appointments and things, talks to daughter every day  Answer theses question about your everyday activities: Can you perform most household chores? N/A - lives in independent living at [facility], meals are provided Are you deaf or have significant trouble hearing? no Do you  feel that you have a problem with memory? Sometimes  Do you feel safe at home?Yes  Last dentist visit? 6 months ago  8. Do you have any difficulty performing your everyday activities? no Are you having any difficulty walking, taking medications on your own, and or difficulty managing daily home needs?no Do you have difficulty walking or climbing stairs?yes  Do you have difficulty dressing or bathing? no Do you have difficulty doing errands alone such as visiting a doctor's office or shopping? Yes  Do you currently have any difficulty preparing food and eating? no Do you currently have any difficulty using the toilet?no Do you have any difficulty managing your finances?no Do you have any difficulties with housekeeping of managing your housekeeping?no   Do you have Advanced Directives in place (Living Will, Healthcare Power or Attorney)? Yes    Last eye Exam and location? 6 months ago    Do you currently use prescribed or non-prescribed narcotic or opioid pain medications? no  Do you have a history or close family history of breast, ovarian, tubal or peritoneal cancer or a family member with BRCA (breast cancer susceptibility 1 and 2) gene mutations? no  .   Nurse/Assistant Credentials/time stamp: Completed with Mykal Good, CMA prior to visit     ----------------------------------------------------------------------------------------------------------------------------------------------------------------------------------------------------------------------  Because this visit was a virtual/telehealth visit, some criteria may be missing or patient reported. Any vitals not documented were not able to be obtained and vitals that have been documented are patient reported.    MEDICARE ANNUAL PREVENTIVE VISIT WITH PROVIDER: (Welcome to Medicare, initial annual wellness or annual wellness exam)  Virtual Visit via Phone Note  I connected with Beth Cantu on 11/04/23 by phone and  verified that I am speaking with the correct person using two identifiers. Patient preferred phone.  Location patient: home Location provider:work or home office Persons participating in the virtual visit: patient, provider  Concerns and/or follow up today: reports everything is stable   See HM section in Epic for other details of completed HM.    ROS: negative for report of fevers, unintentional weight loss, vision changes, vision loss, hearing loss or change, chest pain, sob, hemoptysis, melena, hematochezia, hematuria, falls, bleeding or bruising, thoughts of suicide or self harm, memory loss  Patient-completed extensive health risk assessment - reviewed and discussed with the patient: See Health Risk Assessment completed with patient prior to the visit either above or in recent phone note. This was reviewed in detailed with the patient today and appropriate recommendations, orders and referrals were placed as needed per Summary below and patient instructions.   Review of Medical History: -PMH, PSH, Family History and current specialty and care providers reviewed and updated and listed below   Patient Care Team: [place], Betty G, MD as PCP - General (Family Medicine) Arty Binning, MD (Inactive) as PCP - Cardiology (Cardiology)   Past Medical History:  Diagnosis Date   Back pain    Hyperlipidemia    Varicose veins     Past Surgical History:  Procedure Laterality Date   BIOPSY  12/22/2021   Procedure: BIOPSY;  Surgeon: Normie Becton., MD;  Location: [facility];  Service: Gastroenterology;;   ESOPHAGOGASTRODUODENOSCOPY (EGD) WITH PROPOFOL  N/A 12/22/2021   Procedure: ESOPHAGOGASTRODUODENOSCOPY (EGD) WITH PROPOFOL ;  Surgeon: Normie Becton., MD;  Location: [facility];  Service: Gastroenterology;  Laterality: N/A;    Social History   Socioeconomic History   Marital status: Widowed    Spouse name: Not on file   Number of children: Not on file   Years of  education: Not on file   Highest education level: Not on file  Occupational History   Not on file  Tobacco Use   Smoking status: Never   Smokeless tobacco: Never  Vaping Use   Vaping status: Never Used  Substance and Sexual Activity   Alcohol use: No    Alcohol/week: 0.0 standard drinks of alcohol   Drug use: No   Sexual activity: Not Currently  Other Topics Concern   Not on file  Social History Narrative   Not on file   Social Drivers of Health   Financial Resource Strain: Low Risk  (11/04/2023)   Overall Financial Resource Strain (CARDIA)    Difficulty of Paying Living Expenses: Not hard at all  Food Insecurity: No Food Insecurity (11/04/2023)   Hunger Vital Sign    Worried About Running Out of Food in the Last Year: Never true    Ran Out of Food in the Last Year: Never true  Transportation Needs: No Transportation Needs (11/04/2023)   PRAPARE - Administrator, Civil Service (Medical): No    Lack of Transportation (Non-Medical): No  Physical Activity: Inactive (11/04/2023)   Exercise Vital Sign    Days of Exercise per Week: 0 days    Minutes of Exercise per Session: 0 min  Stress: No Stress Concern Present (11/04/2023)   [employer] of Occupational Health - Occupational Stress Questionnaire    Feeling of Stress : Not at all  Social Connections: Socially Isolated (11/04/2023)   Social Connection and Isolation Panel [NHANES]  Frequency of Communication with Friends and Family: More than three times a week    Frequency of Social Gatherings with Friends and Family: Once a week    Attends Religious Services: Never    Database administrator or Organizations: No    Attends Banker Meetings: Never    Marital Status: Widowed  Intimate Partner Violence: Not At Risk (11/04/2023)   Humiliation, Afraid, Rape, and Kick questionnaire    Fear of Current or Ex-Partner: No    Emotionally Abused: No    Physically Abused: No    Sexually Abused: No     Family History  Problem Relation Age of Onset   Cancer Father        bladder   Colon cancer Neg Hx    Esophageal cancer Neg Hx    Inflammatory bowel disease Neg Hx    Liver disease Neg Hx    Pancreatic cancer Neg Hx    Rectal cancer Neg Hx    Stomach cancer Neg Hx     Current Outpatient Medications on File Prior to Visit  Medication Sig Dispense Refill   augmented betamethasone dipropionate (DIPROLENE-AF) 0.05 % cream Apply topically 2 (two) times daily as needed.     Cyanocobalamin  (VITAMIN B-12 PO) Take 1 tablet by mouth daily.     Ergocalciferol  (VITAMIN D2) 400 units TABS Take 1 tablet by mouth daily.     Multiple Vitamins-Minerals (PRESERVISION AREDS 2 PO) Take 1 tablet by mouth daily.     nystatin  cream (MYCOSTATIN ) Apply 1 Application topically 2 (two) times daily as needed for dry skin. 60 g 4   ondansetron  (ZOFRAN ) 4 MG tablet Take 1 tablet (4 mg total) by mouth daily as needed for nausea. 10 tablet 1   pantoprazole  (PROTONIX ) 40 MG tablet Take 1 tablet (40 mg total) by mouth daily. 180 tablet 0   No current facility-administered medications on file prior to visit.    Allergies  Allergen Reactions   Aspirin     Other reaction(s): Other (See Comments) Other Reaction: GI UPSET   Erythromycin     Other reaction(s): Other (See Comments) Makes her sick       Physical Exam Vitals requested from patient and listed below if patient had equipment and was able to obtain at home for this virtual visit: There were no vitals filed for this visit. Estimated body mass index is 25.26 kg/m as calculated from the following:   Height as of 01/27/23: 5' 3.39" (1.61 m).   Weight as of 01/27/23: 144 lb 6 oz (65.5 kg).  EKG (optional): deferred due to virtual visit  GENERAL: alert, oriented, no acute distress detected, full vision exam deferred due to pandemic and/or virtual encounter   PSYCH/NEURO: pleasant and cooperative, no obvious depression or anxiety, speech and  thought processing grossly intact, Cognitive function grossly intact  Flowsheet Row Clinical Support from 11/04/2023 in [facility]  PHQ-9 Total Score 0           11/04/2023    9:21 AM 01/27/2023   10:49 AM 04/08/2022    1:11 PM 12/03/2021   10:39 AM 04/04/2021   10:40 AM  Depression screen PHQ 2/9  Decreased Interest 0 0 0 1 0  Down, Depressed, Hopeless 0 0 0 0 0  PHQ - 2 Score 0 0 0 1 0  Altered sleeping 0 3  3   Tired, decreased energy 0 3  3   Change in appetite 0 0  0   Feeling bad or failure about yourself  0 0  0   Trouble concentrating 0 0  0   Moving slowly or fidgety/restless 0 3  0   Suicidal thoughts 0 0  0   PHQ-9 Score 0 9  7   Difficult doing work/chores Not difficult at all Somewhat difficult  Very difficult        12/22/2021    8:12 PM 12/23/2021    7:50 AM 04/08/2022    1:12 PM 01/27/2023   10:48 AM 11/04/2023    9:25 AM  Fall Risk  Falls in the past year?   0 0 0  Was there an injury with Fall?   0 0 0  Fall Risk Category Calculator   0 0 0  Fall Risk Category (Retired)   Low    (RETIRED) Patient Fall Risk Level Moderate fall risk Moderate fall risk Low fall risk    Patient at Risk for Falls Due to   No Fall Risks Other (Comment) No Fall Risks  Fall risk Follow up   Falls prevention discussed Falls evaluation completed Falls evaluation completed     SUMMARY AND PLAN:  Encounter for Medicare annual wellness exam  Discussed applicable health maintenance/preventive health measures and advised and referred or ordered per patient preferences: -discussed recs/risks for vaccines; reports plans to get Tdap and covid boosters at the pharmacy, advised to let us  know if get so that we can update records; she is considering shingles vaccine Health Maintenance  Topic Date Due   DTaP/Tdap/Td (1 - Tdap) Never done   Zoster Vaccines- Shingrix (1 of 2) Never done   COVID-19 Vaccine (5 - 2024-25 season) 02/07/2023   INFLUENZA VACCINE   01/07/2024   Medicare Annual Wellness (AWV)  11/03/2024   Pneumonia Vaccine 71+ Years old  Completed   HPV VACCINES  Aged Out   Meningococcal B Vaccine  Aged Out   DEXA SCAN  Discontinued      Education and counseling on the following was provided based on the above review of health and a plan/checklist for the patient, along with additional information discussed, was provided for the patient in the patient instructions :   -Advised and counseled on a healthy lifestyle - including the importance of a healthy diet, regular physical activity, social connections  -Reviewed patient's current diet. Advised and counseled on a whole foods based healthy diet. A summary of a healthy diet was provided in the Patient Instructions.  -reviewed patient's current physical activity level and discussed exercise guidelines for adults. Discussed community resources and ideas for safe exercise at home to assist in meeting exercise guideline recommendations in a safe and healthy way.  -Advise yearly dental visits at minimum and regular eye exams    Follow up: see patient instructions     Patient Instructions  I really enjoyed getting to talk with you today! I am available on Tuesdays and Thursdays for virtual visits if you have any questions or concerns, or if I can be of any further assistance.   CHECKLIST FROM ANNUAL WELLNESS VISIT:  -Follow up (please call to schedule if not scheduled after visit):   -yearly for annual wellness visit with primary care office  Here is a list of your preventive care/health maintenance measures and the plan for each if any are due:  PLAN For any measures below that may be due:   1.) You can get the vaccines at the pharmacy. Please let us  know if you  do so that we can update your chart.   Health Maintenance  Topic Date Due   DTaP/Tdap/Td (1 - Tdap) Never done   Zoster Vaccines- Shingrix (1 of 2) Never done   COVID-19 Vaccine (5 - 2024-25 season) 02/07/2023    Medicare Annual Wellness (AWV)  04/09/2023   INFLUENZA VACCINE  01/07/2024   Pneumonia Vaccine 70+ Years old  Completed   HPV VACCINES  Aged Out   Meningococcal B Vaccine  Aged Out   DEXA SCAN  Discontinued    -See a dentist at least yearly  -Get your eyes checked and then per your eye specialist's recommendations  -Other issues addressed today:   -I have included below further information regarding a healthy whole foods based diet, physical activity guidelines for adults, stress management and opportunities for social connections. I hope you find this information useful.   -----------------------------------------------------------------------------------------------------------------------------------------------------------------------------------------------------------------------------------------------------------    NUTRITION: -eat real food: lots of colorful vegetables (half the plate) and fruits -5-7 servings of vegetables and fruits per day (fresh or steamed is best), exp. 2 servings of vegetables with lunch and dinner and 2 servings of fruit per day. Berries and greens such as kale and collards are great choices.  -consume on a regular basis:  fresh fruits, fresh veggies, fish, nuts, seeds, healthy oils (such as olive oil, avocado oil), whole grains (make sure for bread/pasta/crackers/etc., that the first ingredient on label contains the word "whole"), legumes. -can eat small amounts of dairy and lean meat (no larger than the palm of your hand), but avoid processed meats such as ham, bacon, lunch meat, etc. -drink water -try to avoid fast food and pre-packaged foods, processed meat, ultra processed foods/beverages (donuts, candy, etc.) -most experts advise limiting sodium to < 2300mg  per day, should limit further is any chronic conditions such as high blood pressure, heart disease, diabetes, etc. The American Heart Association advised that < 1500mg  is is ideal -try to avoid  foods/beverages that contain any ingredients with names you do not recognize  -try to avoid foods/beverages  with added sugar or sweeteners/sweets  -try to avoid sweet drinks (including diet drinks): soda, juice, Gatorade, sweet tea, power drinks, diet drinks -try to avoid white rice, white bread, pasta (unless whole grain)  EXERCISE GUIDELINES FOR ADULTS: -if you wish to increase your physical activity, do so gradually and with the approval of your doctor -STOP and seek medical care immediately if you have any chest pain, chest discomfort or trouble breathing when starting or increasing exercise  -move and stretch your body, legs, feet and arms when sitting for long periods -Physical activity guidelines for optimal health in adults: -get at least 150 minutes per week of moderate exercise (can talk, but not sing); this is about 20-30 minutes of sustained activity 5-7 days per week or two 10-15 minute episodes of sustained activity 5-7 days per week -do some muscle building/resistance training/strength training at least 2 days per week  -balance exercises 3+ days per week:   Stand somewhere where you have something sturdy to hold onto if you lose balance    1) lift up on toes, then back down, start with 5x per day and work up to 20x   2) stand and lift one leg straight out to the side so that foot is a few inches of the floor, start with 5x each side and work up to 20x each side   3) stand on one foot, start with 5 seconds each side and work up to 20  seconds on each side  If you need ideas or help with getting more active:  -Silver sneakers https://tools.silversneakers.com  -Walk with a Doc: http://www.duncan-williams.com/  -try to include resistance (weight lifting/strength building) and balance exercises twice per week: or the following link for ideas: http://castillo-powell.com/  BuyDucts.dk  STRESS  MANAGEMENT: -can try meditating, or just sitting quietly with deep breathing while intentionally relaxing all parts of your body for 5 minutes daily -if you need further help with stress, anxiety or depression please follow up with your primary doctor or contact the wonderful folks at [facility]: [phone]  SOCIAL CONNECTIONS: -options in [place] if you wish to engage in more social and exercise related activities:  -Silver sneakers https://tools.silversneakers.com  -Walk with a Doc: http://www.duncan-williams.com/  -Check out the [facility] Active Adults 50+ section on the [place] of [employer] (hiking clubs, book clubs, cards and games, chess, exercise classes, aquatic classes and much more) - see the website for details: https://www.[place]-.gov/departments/parks-recreation/active-adults50  -YouTube has lots of exercise videos for different ages and abilities as well  -[facility] (a variety of indoor and outdoor inperson activities for adults). [phone]. [address]  -Virtual Online Classes (a variety of topics): see seniorplanet.org or call [phone]) [phone]  -consider volunteering at a school, hospice center, church, senior center or elsewhere            Maurie Southern, DO

## 2023-11-04 NOTE — Patient Instructions (Signed)
 I really enjoyed getting to talk with you today! I am available on Tuesdays and Thursdays for virtual visits if you have any questions or concerns, or if I can be of any further assistance.   CHECKLIST FROM ANNUAL WELLNESS VISIT:  -Follow up (please call to schedule if not scheduled after visit):   -yearly for annual wellness visit with primary care office  Here is a list of your preventive care/health maintenance measures and the plan for each if any are due:  PLAN For any measures below that may be due:   1.) You can get the vaccines at the pharmacy. Please let us  know if you do so that we can update your chart.   Health Maintenance  Topic Date Due   DTaP/Tdap/Td (1 - Tdap) Never done   Zoster Vaccines- Shingrix (1 of 2) Never done   COVID-19 Vaccine (5 - 2024-25 season) 02/07/2023   Medicare Annual Wellness (AWV)  04/09/2023   INFLUENZA VACCINE  01/07/2024   Pneumonia Vaccine 57+ Years old  Completed   HPV VACCINES  Aged Out   Meningococcal B Vaccine  Aged Out   DEXA SCAN  Discontinued    -See a dentist at least yearly  -Get your eyes checked and then per your eye specialist's recommendations  -Other issues addressed today:   -I have included below further information regarding a healthy whole foods based diet, physical activity guidelines for adults, stress management and opportunities for social connections. I hope you find this information useful.   -----------------------------------------------------------------------------------------------------------------------------------------------------------------------------------------------------------------------------------------------------------    NUTRITION: -eat real food: lots of colorful vegetables (half the plate) and fruits -5-7 servings of vegetables and fruits per day (fresh or steamed is best), exp. 2 servings of vegetables with lunch and dinner and 2 servings of fruit per day. Berries and greens such as kale  and collards are great choices.  -consume on a regular basis:  fresh fruits, fresh veggies, fish, nuts, seeds, healthy oils (such as olive oil, avocado oil), whole grains (make sure for bread/pasta/crackers/etc., that the first ingredient on label contains the word "whole"), legumes. -can eat small amounts of dairy and lean meat (no larger than the palm of your hand), but avoid processed meats such as ham, bacon, lunch meat, etc. -drink water -try to avoid fast food and pre-packaged foods, processed meat, ultra processed foods/beverages (donuts, candy, etc.) -most experts advise limiting sodium to < 2300mg  per day, should limit further is any chronic conditions such as high blood pressure, heart disease, diabetes, etc. The American Heart Association advised that < 1500mg  is is ideal -try to avoid foods/beverages that contain any ingredients with names you do not recognize  -try to avoid foods/beverages  with added sugar or sweeteners/sweets  -try to avoid sweet drinks (including diet drinks): soda, juice, Gatorade, sweet tea, power drinks, diet drinks -try to avoid white rice, white bread, pasta (unless whole grain)  EXERCISE GUIDELINES FOR ADULTS: -if you wish to increase your physical activity, do so gradually and with the approval of your doctor -STOP and seek medical care immediately if you have any chest pain, chest discomfort or trouble breathing when starting or increasing exercise  -move and stretch your body, legs, feet and arms when sitting for long periods -Physical activity guidelines for optimal health in adults: -get at least 150 minutes per week of moderate exercise (can talk, but not sing); this is about 20-30 minutes of sustained activity 5-7 days per week or two 10-15 minute episodes of sustained activity 5-7 days  per week -do some muscle building/resistance training/strength training at least 2 days per week  -balance exercises 3+ days per week:   Stand somewhere where you have  something sturdy to hold onto if you lose balance    1) lift up on toes, then back down, start with 5x per day and work up to 20x   2) stand and lift one leg straight out to the side so that foot is a few inches of the floor, start with 5x each side and work up to 20x each side   3) stand on one foot, start with 5 seconds each side and work up to 20 seconds on each side  If you need ideas or help with getting more active:  -Silver sneakers https://tools.silversneakers.com  -Walk with a Doc: http://www.duncan-williams.com/  -try to include resistance (weight lifting/strength building) and balance exercises twice per week: or the following link for ideas: http://castillo-powell.com/  BuyDucts.dk  STRESS MANAGEMENT: -can try meditating, or just sitting quietly with deep breathing while intentionally relaxing all parts of your body for 5 minutes daily -if you need further help with stress, anxiety or depression please follow up with your primary doctor or contact the wonderful folks at [facility]: [phone]  SOCIAL CONNECTIONS: -options in [place] if you wish to engage in more social and exercise related activities:  -Silver sneakers https://tools.silversneakers.com  -Walk with a Doc: http://www.duncan-williams.com/  -Check out the [facility] Active Adults 50+ section on the [place] of [employer] (hiking clubs, book clubs, cards and games, chess, exercise classes, aquatic classes and much more) - see the website for details: https://www.[place].gov/departments/parks-recreation/active-adults50  -YouTube has lots of exercise videos for different ages and abilities as well  -[facility] (a variety of indoor and outdoor inperson activities for adults). [phone]. [address]  -Virtual Online Classes (a variety of topics): see seniorplanet.org or call  [phone]  -consider volunteering at a school, hospice center, church, senior center or elsewhere

## 2023-12-07 ENCOUNTER — Ambulatory Visit: Admitting: Podiatry

## 2024-01-03 ENCOUNTER — Ambulatory Visit: Payer: Self-pay | Admitting: Family Medicine

## 2024-01-03 ENCOUNTER — Ambulatory Visit: Admitting: Podiatry

## 2024-01-03 NOTE — Telephone Encounter (Signed)
 FYI Only or Action Required?: Action required by provider: request for appointment.  Patient was last seen in primary care on 11/04/2023 by Luke Chiquita SAUNDERS, DO.  Called Nurse Triage reporting Loss of Consciousness.  Symptoms began today.  Symptoms are: stable.  Triage Disposition: Go to ED Now (or PCP Triage)  Patient/caregiver understands and will follow disposition?: No, wishes to speak with PCP                    Copied from CRM #[id number]. Topic: Clinical - Red Word Triage >> Jan 03, 2024  1:23 PM Franky GRADE wrote: Red Word that prompted transfer to Nurse Triage: Patient's daughter is calling because the pain just fainted, EMS is on site and could not find what caused the episode. Reason for Disposition  [1] Age > 50 years AND [2] now alert and feels fine  Answer Assessment - Initial Assessment Questions This RN spoke with pt's daughter. Pt does not want to go to hospital. EMS is there and they recommend ED or urgent care. Pt daughter refuses ED or urgent care. This RN notified [facility] of pt refusal of ED disposition. Pt daughter requests call back today [phone]  Pt's knees came out from under her and pt fainted; someone caught pt Pt is okay, BP and oxygen is fine, all vitals okay per EMS EKG- small blip which was also noted, a couple of PACs  ONSET: How long were you unconscious? (e.g., minutes, seconds) When did it happen?     1 minute unconscious; one of the people there did a chest rub MENTAL STATUS: Alert and oriented now? (e.g., oriented x 3 = name, month, location)      Back to baseline TRIGGER: What do you think caused the fainting? What were you doing just before you fainted?  (e.g., exercise, sudden standing up, prolonged standing)     Not sure RECURRENT SYMPTOM: Have you ever passed out before? If Yes, ask: When was the last time? and What happened that time?      Not sure INJURY: Did you hurt yourself when you fell?      Pt was  caught, did not get injured  Protocols used: Fainting-A-AH

## 2024-01-03 NOTE — Telephone Encounter (Signed)
 I left pt's daughter a voicemail to call the office back & to get scheduled with PCP.

## 2024-01-05 ENCOUNTER — Ambulatory Visit (INDEPENDENT_AMBULATORY_CARE_PROVIDER_SITE_OTHER): Admitting: Family Medicine

## 2024-01-05 ENCOUNTER — Ambulatory Visit: Payer: Self-pay | Admitting: Family Medicine

## 2024-01-05 ENCOUNTER — Encounter: Payer: Self-pay | Admitting: Family Medicine

## 2024-01-05 VITALS — BP 120/70 | HR 76 | Resp 16 | Ht 63.39 in | Wt 126.0 lb

## 2024-01-05 DIAGNOSIS — R55 Syncope and collapse: Secondary | ICD-10-CM | POA: Diagnosis not present

## 2024-01-05 DIAGNOSIS — E441 Mild protein-calorie malnutrition: Secondary | ICD-10-CM | POA: Diagnosis not present

## 2024-01-05 DIAGNOSIS — G459 Transient cerebral ischemic attack, unspecified: Secondary | ICD-10-CM

## 2024-01-05 DIAGNOSIS — R634 Abnormal weight loss: Secondary | ICD-10-CM

## 2024-01-05 LAB — COMPREHENSIVE METABOLIC PANEL WITH GFR
ALT: 12 U/L (ref 0–35)
AST: 20 U/L (ref 0–37)
Albumin: 3.8 g/dL (ref 3.5–5.2)
Alkaline Phosphatase: 63 U/L (ref 39–117)
BUN: 17 mg/dL (ref 6–23)
CO2: 30 meq/L (ref 19–32)
Calcium: 9.3 mg/dL (ref 8.4–10.5)
Chloride: 101 meq/L (ref 96–112)
Creatinine, Ser: 0.53 mg/dL (ref 0.40–1.20)
GFR: 77.81 mL/min (ref >60.00)
Glucose, Bld: 125 mg/dL — ABNORMAL HIGH (ref 70–99)
Potassium: 4.1 meq/L (ref 3.5–5.1)
Sodium: 138 meq/L (ref 135–145)
Total Bilirubin: 1.1 mg/dL (ref 0.2–1.2)
Total Protein: 6.3 g/dL (ref 6.0–8.3)

## 2024-01-05 LAB — CBC
HCT: 40 % (ref 36.0–46.0)
Hemoglobin: 12.8 g/dL (ref 12.0–15.0)
MCHC: 32 g/dL (ref 30.0–36.0)
MCV: 90.3 fl (ref 78.0–100.0)
Platelets: 284 K/uL (ref 150.0–400.0)
RBC: 4.43 Mil/uL (ref 3.87–5.11)
RDW: 13.8 % (ref 11.5–15.5)
WBC: 8.3 K/uL (ref 4.0–10.5)

## 2024-01-05 NOTE — Progress Notes (Signed)
 ACUTE VISIT Chief Complaint  Patient presents with   Loss of Consciousness   Discussed the use of AI scribe software for clinical note transcription with the patient, who gave verbal consent to proceed. History of Present Illness Beth Cantu is a 89 year old female with a PMHx significant for TIA, Aortic Atherosclerosis, hiatal Hernia, GERD; HLD, high protein malnutrition, and fatigue, who is here today with her daughter complaining of fainting episode that happened on 01/03/24. What apparently happened, according to her daughter who did not directly witness the event but obtained information from those who did, was that Beth Cantu (who lives in an independent care facility) was walking to meet her daughter to go to a podiatrist appointment accompanied by an employee of the facility when her knees collapsed.  The employee caught her, and staff estimates that she had been unconscious for approximately 1 minute. When she woke up, she was described as a little bit garbled and disoriented, similar to when first waking up, but did not recall the episode herself. A retired Engineer, civil (consulting) performed a chest rub, after which she woke up. EMTs were called, and she was assessed on-site.   Denies chest pain, difficulty breathing, or palpitations preceding episode. Post-episode, she has not experienced further weakness, neuro deficit,or similar symptoms but feels less confident in her mobility. No associated urine/bowel incontinence.  Her blood pressure was noted to be lower than her usual, recorded at 110/60, with a heart rate of 93 and oxygen saturation of 96%. Her blood sugar was 185. An EKG was performed.  She felt weak prior to the episode, attributing it to lack of sleep and frequent nighttime urination.   Her nutritional intake is a concern, with a noted weight loss of 30-40 pounds since December. She consumes limited protein, primarily from cheese and occasional meat, and drinks Boost nutritional drinks  inconsistently.   Admits that she doesn't drink much water when planning to go out as she does not want constant bathroom interruptions.  Discussed her protein intake - in a day she will probably have about 20 g of protein daily, but usually eats from noon - 4:30 PM, w/ most of it supplemented by 1-2 Boost protein shakes.  This morning, she says she had a banana and 20 oz of water.   GERD/ hiatal hernia, which necessitates smaller, more frequent meals. Her daughter notes that she does not drink enough fluids and has a limited eating window from noon to 4:30 PM. Currently she is on Pantoprazole  40 mg daily.  Lab Results  Component Value Date   NA 138 01/27/2023   CL 100 01/27/2023   K 4.5 01/27/2023   CO2 30 01/27/2023   BUN 19 01/27/2023   CREATININE 0.64 01/27/2023   GFR 74.85 01/27/2023   CALCIUM 9.7 01/27/2023   ALBUMIN 4.1 01/27/2023   GLUCOSE 116 (H) 01/27/2023   Lab Results  Component Value Date   WBC 6.8 03/27/2022   HGB 13.9 03/27/2022   HCT 42.4 03/27/2022   MCV 90.3 03/27/2022   PLT 209.0 03/27/2022   Review of Systems  Constitutional:  Negative for activity change, appetite change, chills and fever.  HENT:  Negative for facial swelling and sore throat.   Respiratory:  Negative for cough and wheezing.   Gastrointestinal:  Negative for abdominal pain, nausea and vomiting.  Genitourinary:  Negative for decreased urine volume, dysuria and hematuria.  Musculoskeletal:  Positive for arthralgias, back pain and gait problem.  Skin:  Negative for rash.  Neurological:  Negative for seizures, facial asymmetry and headaches.  Psychiatric/Behavioral:  Negative for confusion and hallucinations.   See other pertinent positives and negatives in HPI.  Current Outpatient Medications on File Prior to Visit  Medication Sig Dispense Refill   augmented betamethasone dipropionate (DIPROLENE-AF) 0.05 % cream Apply topically 2 (two) times daily as needed.     Cyanocobalamin  (VITAMIN  B-12 PO) Take 1 tablet by mouth daily.     Ergocalciferol  (VITAMIN D2) 400 units TABS Take 1 tablet by mouth daily.     Multiple Vitamins-Minerals (PRESERVISION AREDS 2 PO) Take 1 tablet by mouth daily.     nystatin  cream (MYCOSTATIN ) Apply 1 Application topically 2 (two) times daily as needed for dry skin. 60 g 4   ondansetron  (ZOFRAN ) 4 MG tablet Take 1 tablet (4 mg total) by mouth daily as needed for nausea. 10 tablet 1   pantoprazole  (PROTONIX ) 40 MG tablet Take 1 tablet (40 mg total) by mouth daily. 180 tablet 0   No current facility-administered medications on file prior to visit.    Past Medical History:  Diagnosis Date   Back pain    Hyperlipidemia    Varicose veins    Allergies  Allergen Reactions   Aspirin     Other reaction(s): Other (See Comments) Other Reaction: GI UPSET   Erythromycin     Other reaction(s): Other (See Comments) Makes her sick    Social History   Socioeconomic History   Marital status: Widowed    Spouse name: Not on file   Number of children: Not on file   Years of education: Not on file   Highest education level: Not on file  Occupational History   Not on file  Tobacco Use   Smoking status: Never   Smokeless tobacco: Never  Vaping Use   Vaping status: Never Used  Substance and Sexual Activity   Alcohol use: No    Alcohol/week: 0.0 standard drinks of alcohol   Drug use: No   Sexual activity: Not Currently  Other Topics Concern   Not on file  Social History Narrative   Not on file   Social Drivers of Health   Financial Resource Strain: Low Risk  (11/04/2023)   Overall Financial Resource Strain (CARDIA)    Difficulty of Paying Living Expenses: Not hard at all  Food Insecurity: No Food Insecurity (11/04/2023)   Hunger Vital Sign    Worried About Running Out of Food in the Last Year: Never true    Ran Out of Food in the Last Year: Never true  Transportation Needs: No Transportation Needs (11/04/2023)   PRAPARE - Therapist, art (Medical): No    Lack of Transportation (Non-Medical): No  Physical Activity: Inactive (11/04/2023)   Exercise Vital Sign    Days of Exercise per Week: 0 days    Minutes of Exercise per Session: 0 min  Stress: No Stress Concern Present (11/04/2023)   [employer] of Occupational Health - Occupational Stress Questionnaire    Feeling of Stress : Not at all  Social Connections: Socially Isolated (11/04/2023)   Social Connection and Isolation Panel    Frequency of Communication with Friends and Family: More than three times a week    Frequency of Social Gatherings with Friends and Family: Once a week    Attends Religious Services: Never    Database administrator or Organizations: No    Attends Banker Meetings: Never    Marital Status:  Widowed    Today's Vitals   01/05/24 1049  BP: 120/70  Pulse: 76  Resp: 16  SpO2: 98%  Weight: 126 lb (57.2 kg)  Height: 5' 3.39 (1.61 m)   Body mass index is 22.05 kg/m. Wt Readings from Last 3 Encounters:  01/05/24 126 lb (57.2 kg)  01/27/23 144 lb 6 oz (65.5 kg)  04/08/22 161 lb (73 kg)   Physical Exam Vitals and nursing note reviewed.  Constitutional:      General: She is not in acute distress.    Appearance: She is well-developed.  HENT:     Head: Normocephalic and atraumatic.     Mouth/Throat:     Mouth: Mucous membranes are moist.     Pharynx: Oropharynx is clear.  Eyes:     Conjunctiva/sclera: Conjunctivae normal.  Cardiovascular:     Rate and Rhythm: Normal rate. Rhythm irregular. Occasional Extrasystoles are present.    Heart sounds: Murmur heard.     Comments: LLE lymphedema. Pulmonary:     Effort: Pulmonary effort is normal. No respiratory distress.     Breath sounds: Normal breath sounds.  Abdominal:     Palpations: Abdomen is soft. There is no mass.     Tenderness: There is no abdominal tenderness.  Lymphadenopathy:     Cervical: No cervical adenopathy.  Skin:    General: Skin  is warm.     Findings: No erythema or rash.  Neurological:     General: No focal deficit present.     Mental Status: She is alert and oriented to person, place, and time.     Comments: Mildly unstable , antalgic gait associated with a cane.  Psychiatric:        Mood and Affect: Mood and affect normal.    ASSESSMENT AND PLAN: Beth Cantu was seen here today for Loss of Consciousness.   Lab Results  Component Value Date   NA 138 01/05/2024   CL 101 01/05/2024   K 4.1 01/05/2024   CO2 30 01/05/2024   BUN 17 01/05/2024   CREATININE 0.53 01/05/2024   GFR 77.81 01/05/2024   CALCIUM 9.3 01/05/2024   ALBUMIN 3.8 01/05/2024   GLUCOSE 125 (H) 01/05/2024   Lab Results  Component Value Date   ALT 12 01/05/2024   AST 20 01/05/2024   ALKPHOS 63 01/05/2024   BILITOT 1.1 01/05/2024   Lab Results  Component Value Date   WBC 8.3 01/05/2024   HGB 12.8 01/05/2024   HCT 40.0 01/05/2024   MCV 90.3 01/05/2024   PLT 284.0 01/05/2024   Syncope and collapse We discussed possible etiologies, some of her chronic comorbidities could be contributing factors. Examination today did not reveal significant change from last one. EKG done by EMS with no significant abnormalities when compared with EKG done in 12/2021.Q waves in inferior leads and early repolarization like changes as well as PAC's. Stressed the importance of adequate hydration. Fall precautions. We agree on no further cardiac or neuro workup unless problem becomes recurrent. Further recommendation will be given according to lab results.  -     Comprehensive metabolic panel with GFR; Future -     CBC; Future  Weight loss, abnormal 126 Lb today. She has lost about 18 pounds since 01/2023. Most likely related to decreased oral intake. We discussed possible complications of low BMI.  -     Comprehensive metabolic panel with GFR; Future -     CBC; Future  TIA (transient ischemic attack) Assessment & Plan:  She is no longer on  antiplatelet therapy or Plavix  not on statin medication.  Mild protein malnutrition (HCC) Assessment & Plan: Stress the importance of adequate amount of protein intake , at least 0.8-1 g/Kg. 12/2021 alb was 3.0.  I spent a total of 46 minutes in both face to face and non face to face activities for this visit on the date of this encounter. During this time history was obtained and documented, examination was performed, prior labs/imaging reviewed, and assessment/plan discussed.  Return if symptoms worsen or fail to improve, for keep next appointment.  I,Emily Lagle,acting as a Neurosurgeon for Virgilio Broadhead [place], MD.,have documented all relevant documentation on the behalf of Carver Murakami [place], MD,as directed by  Sheilyn Boehlke [place], MD while in the presence of Haeden Hudock [place], MD.  I, Isrrael Fluckiger [place], MD, have reviewed all documentation for this visit. The documentation on 01/05/24 for the exam, diagnosis, procedures, and orders are all accurate and complete.  Hazelene Doten G. [place], MD  [facility]. [place] office.

## 2024-01-05 NOTE — Assessment & Plan Note (Signed)
 She is no longer on antiplatelet therapy or Plavix  not on statin medication.

## 2024-01-05 NOTE — Patient Instructions (Addendum)
 A few things to remember from today's visit:  Syncope and collapse - Plan: Comprehensive metabolic panel with GFR, CBC  Weight loss, abnormal - Plan: Comprehensive metabolic panel with GFR, CBC At least 0.8 g per kilogram of protein per day. Fall precautions to continue. Adequate water intake.  If you need refills for medications you take chronically, please call your pharmacy. Do not use My Chart to request refills or for acute issues that need immediate attention. If you send a my chart message, it may take a few days to be addressed, specially if I am not in the office.  Please be sure medication list is accurate. If a new problem present, please set up appointment sooner than planned today.

## 2024-01-05 NOTE — Assessment & Plan Note (Signed)
 Stress the importance of adequate amount of protein intake , at least 0.8 g/Kg.

## 2024-01-06 ENCOUNTER — Telehealth: Payer: Self-pay | Admitting: Family Medicine

## 2024-01-06 NOTE — Telephone Encounter (Signed)
 Copied from CRM [id number]. Topic: Clinical - Lab/Test Results >> Jan 06, 2024 11:33 AM Beth Cantu wrote: Reason for CRM: Patient requesting a copy of her most recent lab results via mail.

## 2024-01-07 NOTE — Telephone Encounter (Signed)
 Mailed to pt

## 2024-01-28 ENCOUNTER — Encounter: Payer: Medicare HMO | Admitting: Family Medicine

## 2024-01-28 DIAGNOSIS — E7800 Pure hypercholesterolemia, unspecified: Secondary | ICD-10-CM

## 2024-01-28 DIAGNOSIS — Z0000 Encounter for general adult medical examination without abnormal findings: Secondary | ICD-10-CM

## 2024-02-10 ENCOUNTER — Encounter: Payer: Self-pay | Admitting: Podiatry

## 2024-02-10 ENCOUNTER — Ambulatory Visit: Admitting: Podiatry

## 2024-02-10 DIAGNOSIS — B351 Tinea unguium: Secondary | ICD-10-CM

## 2024-02-10 DIAGNOSIS — M79672 Pain in left foot: Secondary | ICD-10-CM | POA: Diagnosis not present

## 2024-02-10 DIAGNOSIS — M79671 Pain in right foot: Secondary | ICD-10-CM | POA: Diagnosis not present

## 2024-02-10 NOTE — Progress Notes (Signed)
 Patient presents for evaluation and treatment of tenderness and some redness around nails feet.  Tenderness around toes with walking and wearing shoes.  Physical exam:  General appearance: Alert, pleasant, and in no acute distress.  Vascular: Pedal pulses: DP 1/4 B/L, PT 2/4 B/L.  Mild to moderate edema lower legs bilaterally  Neu  Dermatologic:  Nails thickened, disfigured, discolored 2-5 BL with subungual debris.  Redness and hypertrophic nail folds along nail folds bilaterally but no signs of drainage or infection.  Musculoskeletal:     Diagnosis: 1. Painful onychomycotic nails 2 through 5 bilaterally. 2. Pain toes 2 through 5 bilaterally.  Plan: -Debrided onychomycotic nails 2 through 5 bilaterally.  Sharply debrided nails with nail clipper and reduced with a power bur.  -Patient would like to have nails debrided every 2 months.  I explained that her insurance might not cover it every 2 months and she understands that she might need to pay out-of-pocket.  Return 2 months RFC

## 2024-04-11 ENCOUNTER — Encounter: Payer: Self-pay | Admitting: Podiatry

## 2024-04-11 ENCOUNTER — Ambulatory Visit: Admitting: Podiatry

## 2024-04-11 DIAGNOSIS — M79672 Pain in left foot: Secondary | ICD-10-CM | POA: Diagnosis not present

## 2024-04-11 DIAGNOSIS — M79671 Pain in right foot: Secondary | ICD-10-CM | POA: Diagnosis not present

## 2024-04-11 DIAGNOSIS — B351 Tinea unguium: Secondary | ICD-10-CM | POA: Diagnosis not present

## 2024-04-11 NOTE — Progress Notes (Signed)
 Patient presents for evaluation and treatment of tenderness and some redness around nails feet.  Tenderness around toes with walking and wearing shoes.  Physical exam:  General appearance: Alert, pleasant, and in no acute distress.  Vascular: Pedal pulses: DP 1/4 B/L, PT 2/4 B/L.  Mild to moderate edema lower legs bilaterally  Neurologic:  Dermatologic:  Nails thickened, disfigured, discolored 1-5 BL with subungual debris.  Redness and hypertrophic nail folds along nail folds bilaterally but no signs of drainage or infection.  Musculoskeletal:     Diagnosis: 1. Painful onychomycotic nails 1 through 5 bilaterally. 2. Pain toes 1 through 5 bilaterally.  Plan: -Debrided onychomycotic nails 1 through 5 bilaterally.  Sharply debrided nails with nail clipper and reduced with a power bur.  Return 3 months RFC

## 2024-04-22 ENCOUNTER — Other Ambulatory Visit: Payer: Self-pay | Admitting: Gastroenterology

## 2024-04-23 ENCOUNTER — Emergency Department (HOSPITAL_COMMUNITY)

## 2024-04-23 ENCOUNTER — Encounter (HOSPITAL_COMMUNITY): Payer: Self-pay | Admitting: Emergency Medicine

## 2024-04-23 ENCOUNTER — Other Ambulatory Visit: Payer: Self-pay

## 2024-04-23 ENCOUNTER — Inpatient Hospital Stay (HOSPITAL_COMMUNITY)
Admission: EM | Admit: 2024-04-23 | Discharge: 2024-04-27 | DRG: 374 | Disposition: A | Discharge status: Hospice | Attending: Internal Medicine | Admitting: Internal Medicine

## 2024-04-23 DIAGNOSIS — D509 Iron deficiency anemia, unspecified: Secondary | ICD-10-CM | POA: Diagnosis present

## 2024-04-23 DIAGNOSIS — Z886 Allergy status to analgesic agent status: Secondary | ICD-10-CM

## 2024-04-23 DIAGNOSIS — I878 Other specified disorders of veins: Secondary | ICD-10-CM | POA: Diagnosis present

## 2024-04-23 DIAGNOSIS — E43 Unspecified severe protein-calorie malnutrition: Secondary | ICD-10-CM | POA: Diagnosis present

## 2024-04-23 DIAGNOSIS — K6389 Other specified diseases of intestine: Secondary | ICD-10-CM | POA: Diagnosis not present

## 2024-04-23 DIAGNOSIS — C787 Secondary malignant neoplasm of liver and intrahepatic bile duct: Secondary | ICD-10-CM | POA: Diagnosis present

## 2024-04-23 DIAGNOSIS — Z8673 Personal history of transient ischemic attack (TIA), and cerebral infarction without residual deficits: Secondary | ICD-10-CM

## 2024-04-23 DIAGNOSIS — I10 Essential (primary) hypertension: Secondary | ICD-10-CM | POA: Diagnosis present

## 2024-04-23 DIAGNOSIS — C481 Malignant neoplasm of specified parts of peritoneum: Secondary | ICD-10-CM | POA: Diagnosis not present

## 2024-04-23 DIAGNOSIS — R54 Age-related physical debility: Secondary | ICD-10-CM | POA: Diagnosis present

## 2024-04-23 DIAGNOSIS — K2100 Gastro-esophageal reflux disease with esophagitis, without bleeding: Secondary | ICD-10-CM | POA: Diagnosis present

## 2024-04-23 DIAGNOSIS — K449 Diaphragmatic hernia without obstruction or gangrene: Secondary | ICD-10-CM | POA: Diagnosis present

## 2024-04-23 DIAGNOSIS — D75839 Thrombocytosis, unspecified: Secondary | ICD-10-CM | POA: Diagnosis present

## 2024-04-23 DIAGNOSIS — R1907 Generalized intra-abdominal and pelvic swelling, mass and lump: Secondary | ICD-10-CM | POA: Diagnosis present

## 2024-04-23 DIAGNOSIS — Z881 Allergy status to other antibiotic agents status: Secondary | ICD-10-CM

## 2024-04-23 DIAGNOSIS — E441 Mild protein-calorie malnutrition: Secondary | ICD-10-CM | POA: Diagnosis present

## 2024-04-23 DIAGNOSIS — D72829 Elevated white blood cell count, unspecified: Secondary | ICD-10-CM | POA: Diagnosis present

## 2024-04-23 DIAGNOSIS — D649 Anemia, unspecified: Secondary | ICD-10-CM | POA: Diagnosis present

## 2024-04-23 DIAGNOSIS — R1900 Intra-abdominal and pelvic swelling, mass and lump, unspecified site: Principal | ICD-10-CM | POA: Diagnosis present

## 2024-04-23 DIAGNOSIS — R627 Adult failure to thrive: Secondary | ICD-10-CM | POA: Diagnosis present

## 2024-04-23 DIAGNOSIS — Z515 Encounter for palliative care: Secondary | ICD-10-CM

## 2024-04-23 DIAGNOSIS — G8929 Other chronic pain: Secondary | ICD-10-CM | POA: Diagnosis present

## 2024-04-23 DIAGNOSIS — K5900 Constipation, unspecified: Secondary | ICD-10-CM | POA: Diagnosis present

## 2024-04-23 DIAGNOSIS — Z66 Do not resuscitate: Secondary | ICD-10-CM | POA: Diagnosis present

## 2024-04-23 DIAGNOSIS — R531 Weakness: Secondary | ICD-10-CM

## 2024-04-23 DIAGNOSIS — Z6821 Body mass index (BMI) 21.0-21.9, adult: Secondary | ICD-10-CM

## 2024-04-23 DIAGNOSIS — E785 Hyperlipidemia, unspecified: Secondary | ICD-10-CM | POA: Diagnosis present

## 2024-04-23 LAB — COMPREHENSIVE METABOLIC PANEL WITH GFR
ALT: 11 U/L (ref 0–44)
AST: 18 U/L (ref 15–41)
Albumin: 3.2 g/dL — ABNORMAL LOW (ref 3.5–5.0)
Alkaline Phosphatase: 77 U/L (ref 38–126)
Anion gap: 11 (ref 5–15)
BUN: 28 mg/dL — ABNORMAL HIGH (ref 8–23)
CO2: 29 mmol/L (ref 22–32)
Calcium: 9.5 mg/dL (ref 8.9–10.3)
Chloride: 104 mmol/L (ref 98–111)
Creatinine, Ser: 0.51 mg/dL (ref 0.44–1.00)
GFR, Estimated: >60 mL/min (ref >60)
Glucose, Bld: 144 mg/dL — ABNORMAL HIGH (ref 70–99)
Potassium: 4.6 mmol/L (ref 3.5–5.1)
Sodium: 145 mmol/L (ref 135–145)
Total Bilirubin: 0.8 mg/dL (ref 0.0–1.2)
Total Protein: 6.3 g/dL — ABNORMAL LOW (ref 6.5–8.1)

## 2024-04-23 LAB — CBC
HCT: 35.3 % — ABNORMAL LOW (ref 36.0–46.0)
Hemoglobin: 11.4 g/dL — ABNORMAL LOW (ref 12.0–15.0)
MCH: 28.6 pg (ref 26.0–34.0)
MCHC: 32.3 g/dL (ref 30.0–36.0)
MCV: 88.5 fL (ref 80.0–100.0)
Platelets: 463 K/uL — ABNORMAL HIGH (ref 150–400)
RBC: 3.99 MIL/uL (ref 3.87–5.11)
RDW: 13.2 % (ref 11.5–15.5)
WBC: 15.8 K/uL — ABNORMAL HIGH (ref 4.0–10.5)
nRBC: 0 % (ref 0.0–0.2)

## 2024-04-23 LAB — IRON AND TIBC
Iron: 14 ug/dL — ABNORMAL LOW (ref 28–170)
Saturation Ratios: 6 % — ABNORMAL LOW (ref 10.4–31.8)
TIBC: 234 ug/dL — ABNORMAL LOW (ref 250–450)
UIBC: 220 ug/dL

## 2024-04-23 LAB — LIPASE, BLOOD: Lipase: 14 U/L (ref 11–51)

## 2024-04-23 LAB — MAGNESIUM: Magnesium: 1.9 mg/dL (ref 1.7–2.4)

## 2024-04-23 LAB — FERRITIN: Ferritin: 240 ng/mL (ref 11–307)

## 2024-04-23 LAB — VITAMIN B12: Vitamin B-12: 737 pg/mL (ref 180–914)

## 2024-04-23 MED ORDER — IOHEXOL 300 MG/ML  SOLN
100.0000 mL | Freq: Once | INTRAMUSCULAR | Status: AC | PRN
  Administered 2024-04-23: 100 mL via INTRAVENOUS

## 2024-04-23 MED ORDER — DOCUSATE SODIUM 100 MG PO CAPS
100.0000 mg | ORAL_CAPSULE | Freq: Every day | ORAL | Status: DC
  Administered 2024-04-25 – 2024-04-26 (×2): 100 mg via ORAL
  Filled 2024-04-23 (×4): qty 1

## 2024-04-23 MED ORDER — ONDANSETRON HCL 4 MG/2ML IJ SOLN: 4.0000 mg | Freq: Four times a day (QID) | INTRAMUSCULAR | Status: DC | PRN

## 2024-04-23 MED ORDER — ENSURE PLUS HIGH PROTEIN PO LIQD
237.0000 mL | Freq: Once | ORAL | Status: AC
  Administered 2024-04-23: 237 mL via ORAL
  Filled 2024-04-23: qty 237

## 2024-04-23 MED ORDER — HYDROCODONE-ACETAMINOPHEN 5-325 MG PO TABS
1.0000 | ORAL_TABLET | ORAL | Status: DC | PRN
  Administered 2024-04-26: 1 via ORAL
  Administered 2024-04-26: 2 via ORAL
  Filled 2024-04-23: qty 2
  Filled 2024-04-23: qty 1

## 2024-04-23 MED ORDER — SODIUM CHLORIDE 0.9 % IV SOLN: INTRAVENOUS | Status: AC

## 2024-04-23 MED ORDER — ACETAMINOPHEN 325 MG PO TABS: 650.0000 mg | ORAL_TABLET | Freq: Four times a day (QID) | ORAL | Status: DC | PRN

## 2024-04-23 MED ORDER — FLEET ENEMA RE ENEM: 1.0000 | ENEMA | Freq: Once | RECTAL | Status: DC

## 2024-04-23 MED ORDER — ENSURE PLUS HIGH PROTEIN PO LIQD
237.0000 mL | Freq: Two times a day (BID) | ORAL | Status: DC
  Administered 2024-04-24 – 2024-04-27 (×4): 237 mL via ORAL

## 2024-04-23 MED ORDER — ONDANSETRON HCL 4 MG PO TABS: 4.0000 mg | ORAL_TABLET | Freq: Four times a day (QID) | ORAL | Status: DC | PRN

## 2024-04-23 MED ORDER — ACETAMINOPHEN 650 MG RE SUPP: 650.0000 mg | Freq: Four times a day (QID) | RECTAL | Status: DC | PRN

## 2024-04-23 NOTE — ED Notes (Signed)
 ED TO INPATIENT HANDOFF REPORT  ED Nurse Name and Phone #: [phone]  S Name/Age/Gender Beth Cantu 89 y.o. female Room/Bed: WA07/WA07  Code Status   Code Status: Limited: Do not attempt resuscitation (DNR) -DNR-LIMITED -Do Not Intubate/DNI   Home/SNF/Other Independent Living Patient oriented to: self, place, time, and situation Is this baseline? Yes   Triage Complete: Triage complete  Chief Complaint Mesenteric mass [K63.89]  Triage Note Pt arriving via EMS from [facility] for lower abdominal pain. Pt has not had a full bowel movement in approx 6 days. Lower abdomen is firm and distended upon arrival. Pt A&O x4. Pt reports decreased appetite over the last 4 days due to consistent full feeling in her belly. Pt also has hx of hiatal hernia.   Allergies Allergies  Allergen Reactions   Aspirin     Other reaction(s): Other (See Comments) Other Reaction: GI UPSET   Erythromycin     Other reaction(s): Other (See Comments) Makes her sick    Level of Care/Admitting Diagnosis ED Disposition     ED Disposition  Admit   Condition  --   Comment  Hospital Area: [facility] [[id number]]  Level of Care: Med-Surg [16]  May place patient in observation at [facility] or [facility] if equivalent level of care is available:: No  Diagnosis: Mesenteric mass [344047]  Admitting Physician: WADDELL RAKE [[id number]]  Attending Physician: WADDELL RAKE [[id number]]          B Medical/Surgery History Past Medical History:  Diagnosis Date   Back pain    Hyperlipidemia    Varicose veins    Past Surgical History:  Procedure Laterality Date   BIOPSY  12/22/2021   Procedure: BIOPSY;  Surgeon: Wilhelmenia Aloha Raddle., MD;  Location: [facility];  Service: Gastroenterology;;   ESOPHAGOGASTRODUODENOSCOPY (EGD) WITH PROPOFOL  N/A 12/22/2021   Procedure: ESOPHAGOGASTRODUODENOSCOPY (EGD) WITH PROPOFOL ;  Surgeon: Wilhelmenia Aloha Raddle., MD;  Location: [facility];  Service: Gastroenterology;  Laterality: N/A;     A IV Location/Drains/Wounds Patient Lines/Drains/Airways Status     Active Line/Drains/Airways     Name Placement date Placement time Site Days   Peripheral IV 04/23/24 20 G Anterior;Left Forearm 04/23/24  1635  Forearm  less than 1            Intake/Output Last 24 hours No intake or output data in the 24 hours ending 04/23/24 2140  Labs/Imaging Results for orders placed or performed during the hospital encounter of 04/23/24 (from the past 48 hours)  Lipase, blood     Status: None   Collection Time: 04/23/24  4:37 PM  Result Value Ref Range   Lipase 14 11 - 51 U/L    Comment: Performed at [facility], [address], [place], [place] [address]  Comprehensive metabolic panel     Status: Abnormal   Collection Time: 04/23/24  4:37 PM  Result Value Ref Range   Sodium 145 135 - 145 mmol/L   Potassium 4.6 3.5 - 5.1 mmol/L   Chloride 104 98 - 111 mmol/L   CO2 29 22 - 32 mmol/L   Glucose, Bld 144 (H) 70 - 99 mg/dL    Comment: Glucose reference range applies only to samples taken after fasting for at least 8 hours.   BUN 28 (H) 8 - 23 mg/dL   Creatinine, Ser 9.48 0.44 - 1.00 mg/dL   Calcium 9.5 8.9 - 89.6 mg/dL   Total Protein 6.3 (L) 6.5 - 8.1 g/dL  Albumin 3.2 (L) 3.5 - 5.0 g/dL   AST 18 15 - 41 U/L   ALT 11 0 - 44 U/L   Alkaline Phosphatase 77 38 - 126 U/L   Total Bilirubin 0.8 0.0 - 1.2 mg/dL   GFR, Estimated >39 >39 mL/min    Comment: (NOTE) Calculated using the CKD-EPI Creatinine Equation (2021)    Anion gap 11 5 - 15    Comment: Performed at [facility], [address] [place], [place] [address]  CBC     Status: Abnormal   Collection Time: 04/23/24  4:37 PM  Result Value Ref Range   WBC 15.8 (H) 4.0 - 10.5 K/uL   RBC 3.99 3.87 - 5.11 MIL/uL   Hemoglobin 11.4 (L) 12.0 - 15.0 g/dL   HCT 64.6 (L) 63.9 - 53.9 %   MCV 88.5 80.0 - 100.0 fL   MCH 28.6 26.0 - 34.0  pg   MCHC 32.3 30.0 - 36.0 g/dL   RDW 86.7 88.4 - 84.4 %   Platelets 463 (H) 150 - 400 K/uL   nRBC 0.0 0.0 - 0.2 %    Comment: Performed at [facility], [address], [place], [place] [address]   CT ABDOMEN PELVIS W CONTRAST Result Date: 04/23/2024 EXAM: CT ABDOMEN AND PELVIS WITH CONTRAST 04/23/2024 06:27:13 PM TECHNIQUE: CT of the abdomen and pelvis was performed with the administration of 100 mL iohexol  (OMNIPAQUE ) 300 MG/ML solution. Multiplanar reformatted images are provided for review. Automated exposure control, iterative reconstruction, and/or weight-based adjustment of the mA/kV was utilized to reduce the radiation dose to as low as reasonably achievable. COMPARISON: None available. CLINICAL HISTORY: LLQ abdominal pain; RLQ abdominal pain FINDINGS: LOWER CHEST: Large volume hiatal hernia on the left containing the entire stomach as well as a long loop of transverse colon. LIVER: Total of 3 hypodense peripherally enhancing hepatic masses, for example, 2.7 cm lesion (2.27). GALLBLADDER AND BILE DUCTS: Gallbladder is unremarkable. No biliary ductal dilatation. SPLEEN: No acute abnormality. PANCREAS: No acute abnormality. ADRENAL GLANDS: No acute abnormality. KIDNEYS, URETERS AND BLADDER: 4 mm left nephrolithiasis. No right nephrolithiasis. No ureterolithiasis bilaterally. No hydroureteronephrosis. No perinephric or periureteral stranding. No filling defects of the partially visualized collecting systems on delayed imaging. The urinary bladder lumen is dilated with urine. GI AND BOWEL: Stomach demonstrates no acute abnormality. There is no bowel obstruction. Colonic diverticulosis. Stool within the rectum. No small or large bowel thickening or dilatation. The appendix is not definitely identified with no inflammatory changes in the right lower quadrant to suggest acute appendicitis. Questioned terminal ileum bowel wall thickening with the terminal ileum extending along the right  lower quadrant mass. PERITONEUM AND RETROPERITONEUM: There is a peripherally enhancing lobulated multiloculated mass within the right lower abdomen mesentery (2:58, 8.44). This mass abuts the terminal ileum as well as a short segment of the cecum. No ascites. No free air. VASCULATURE: Aorta is normal in caliber. LYMPH NODES: No lymphadenopathy. REPRODUCTIVE ORGANS: Status post hysterectomy. No adnexal mass. BONES AND SOFT TISSUES: Diffusely decreased bone density. No acute fracture. No suspicious lytic or blastic osseous lesions. No focal soft tissue abnormality. IMPRESSION: 1. Peripherally enhancing lobulated multiloculated right lower abdominal mesenteric mass abutting the terminal ileum and sigmoid, with suspected terminal ileal wall thickening. Findings concerning for malignancy. 2. Three hypodense peripherally enhancing hepatic masses suggestive of metastases. 3. Large volume hiatal hernia containing the entire stomach and a long loop of transverse colon. No associated obstruction or findings to suggest ischemia. 4.  Nonobstructive 4 mm left nephrolithiasis. 5. Colonic diverticulosis without evidence of diverticulitis. 6. Other, non-acute and/or normal findings as above. Electronically signed by: Morgane Naveau MD 04/23/2024 06:45 PM EST RP Workstation: [id number]    Pending Labs Unresulted Labs (From admission, onward)     Start     Ordered   04/24/24 0500  Basic metabolic panel  Tomorrow morning,   R        04/23/24 2131   04/24/24 0500  CBC  Tomorrow morning,   R        04/23/24 2131   04/23/24 2132  Magnesium  Once,   R        04/23/24 2131   04/23/24 2115  TSH  Once,   R        04/23/24 2115   04/23/24 2112  Ferritin  Once,   R        04/23/24 2111   04/23/24 2112  Iron and TIBC  Once,   R        04/23/24 2111   04/23/24 2112  Vitamin B12  Once,   R        04/23/24 2111   04/23/24 2107  CA 125  Once,   URGENT        04/23/24 2106   04/23/24 2104  CEA  Once,   URGENT        04/23/24  2103   04/23/24 1627  Urinalysis, Routine w reflex microscopic -Urine, Clean Catch  Once,   URGENT       Question:  Specimen Source  Answer:  Urine, Clean Catch   04/23/24 1627            Vitals/Pain Today's Vitals   04/23/24 1715 04/23/24 1730 04/23/24 1745 04/23/24 1808  BP: (!) 146/60 (!) 123/57 116/76 130/62  Pulse: 92 80 84 83  Resp:    16  Temp:    97.6 F (36.4 C)  TempSrc:    Oral  SpO2: 96% 95% 95% 95%  PainSc:        Isolation Precautions No active isolations  Medications Medications  sodium phosphate (FLEET) enema 1 enema (1 enema Rectal Not Given 04/23/24 2138)  feeding supplement (ENSURE PLUS HIGH PROTEIN) liquid 237 mL (has no administration in time range)  0.9 %  sodium chloride  infusion (has no administration in time range)  acetaminophen  (TYLENOL ) tablet 650 mg (has no administration in time range)    Or  acetaminophen  (TYLENOL ) suppository 650 mg (has no administration in time range)  HYDROcodone-acetaminophen  (NORCO/VICODIN) 5-325 MG per tablet 1-2 tablet (has no administration in time range)  ondansetron  (ZOFRAN ) tablet 4 mg (has no administration in time range)    Or  ondansetron  (ZOFRAN ) injection 4 mg (has no administration in time range)  docusate sodium (COLACE) capsule 100 mg (has no administration in time range)  iohexol  (OMNIPAQUE ) 300 MG/ML solution 100 mL (100 mLs Intravenous Contrast Given 04/23/24 1816)    Mobility Typically walks at home. Twana snot been walking lately due to increased weakness     Focused Assessments Abdominal mass   R Recommendations: See Admitting Provider Note  Report given to:   Additional Notes: pt aaox4. Per pt and pt family, pt typically walks at home has not done so recently due to increased weakness.

## 2024-04-23 NOTE — H&P (Addendum)
 History and Physical    Patient: Beth Cantu FMW:[id number] DOB: 1924-09-16 DOA: 04/23/2024 DOS: the patient was seen and examined on 04/23/2024 PCP: Jordan, Betty G, MD  Patient coming from: ALF/IL[facility] - [place]. Uses walker at times    Chief Complaint: abdominal pain   HPI: DEBROH SIELOFF is a 89 y.o. female with medical history significant of HLD, GERD, hx of TIA, hiatal hernia varicose veins, venous stasis who presented to ED with lower abdominal pain and constipation over the past week. Also has had decreased appetite and increased fatigue with significant weakness. She states it's hard to eat due to her hiatal hernia. She has lost a significant amount of weight (can not tell me number) and is so weak she can hardly make it across the room. She denies any N/V/D. Pain in her abdomen is mainly in RLQ. Dull pain. She has had a BM, but feels constipated. She takes a fiber supplement daily.    Denies any fever/chills, vision changes/headaches, chest pain or palpitations, shortness of breath or cough, N/V/D, dysuria or leg swelling.    She does not smoke or drink alcohol.   ER Course:  vitals: afebrile, bp: 138/59, HR: 83, RR: 45-->16, oxygen: 98%RA Pertinent labs: WBC: 15.8, hgb: 11.4, platelets 463,  CT abdomen/pelvis: Peripherally enhancing lobulated multiloculated right lower abdominal mesenteric mass abutting the terminal ileum and sigmoid, with suspected terminal ileal wall thickening. Findings concerning for malignancy. 2. Three hypodense peripherally enhancing hepatic masses suggestive of metastases. 3. Large volume hiatal hernia containing the entire stomach and a long loop of transverse colon. No associated obstruction or findings to suggest ischemia. 4. Nonobstructive 4 mm left nephrolithiasis. 5. Colonic diverticulosis without evidence of diverticulitis. 6. Other, non-acute and/or normal findings as above. In ED: oncology consulted. Ensure stared and ordered an enema. Patient  refused. TRH asked to admit.    Review of Systems: As mentioned in the history of present illness. All other systems reviewed and are negative. Past Medical History:  Diagnosis Date   Back pain    Hyperlipidemia    Varicose veins    Past Surgical History:  Procedure Laterality Date   BIOPSY  12/22/2021   Procedure: BIOPSY;  Surgeon: Wilhelmenia Aloha Raddle., MD;  Location: [facility];  Service: Gastroenterology;;   ESOPHAGOGASTRODUODENOSCOPY (EGD) WITH PROPOFOL  N/A 12/22/2021   Procedure: ESOPHAGOGASTRODUODENOSCOPY (EGD) WITH PROPOFOL ;  Surgeon: Wilhelmenia Aloha Raddle., MD;  Location: [facility];  Service: Gastroenterology;  Laterality: N/A;   Social History:  reports that she has never smoked. She has never used smokeless tobacco. She reports that she does not drink alcohol and does not use drugs.  Allergies  Allergen Reactions   Aspirin     Other reaction(s): Other (See Comments) Other Reaction: GI UPSET   Erythromycin     Other reaction(s): Other (See Comments) Makes her sick    Family History  Problem Relation Age of Onset   Cancer Father        bladder   Colon cancer Neg Hx    Esophageal cancer Neg Hx    Inflammatory bowel disease Neg Hx    Liver disease Neg Hx    Pancreatic cancer Neg Hx    Rectal cancer Neg Hx    Stomach cancer Neg Hx     Prior to Admission medications   Medication Sig Start Date End Date Taking? Authorizing Provider  augmented betamethasone dipropionate (DIPROLENE-AF) 0.05 % cream Apply topically 2 (two) times daily as needed. 12/11/21   [provider]  Cyanocobalamin  (VITAMIN B-12 PO) Take 1 tablet by mouth daily.    [provider]  Ergocalciferol  (VITAMIN D2) 400 units TABS Take 1 tablet by mouth daily.    [provider]  Multiple Vitamins-Minerals (PRESERVISION AREDS 2 PO) Take 1 tablet by mouth daily.    [provider]  nystatin  cream (MYCOSTATIN ) Apply 1 Application topically 2 (two) times daily as  needed for dry skin. 04/30/23   Jordan, Betty G, MD  ondansetron  (ZOFRAN ) 4 MG tablet Take 1 tablet (4 mg total) by mouth daily as needed for nausea. 01/27/23   Jordan, Betty G, MD  pantoprazole  (PROTONIX ) 40 MG tablet Take 1 tablet (40 mg total) by mouth daily. 06/07/23   Mansouraty, Aloha Raddle., MD    Physical Exam: Vitals:   04/23/24 1715 04/23/24 1730 04/23/24 1745 04/23/24 1808  BP: (!) 146/60 (!) 123/57 116/76 130/62  Pulse: 92 80 84 83  Resp:    16  Temp:    97.6 F (36.4 C)  TempSrc:    Oral  SpO2: 96% 95% 95% 95%   General:  Appears calm and comfortable and is in NAD. Cachetic  Eyes:  PERRL, EOMI, normal lids, iris ENT:  grossly normal hearing, lips & tongue, mmm; appropriate dentition Neck:  no LAD, masses or thyromegaly; no carotid bruits Cardiovascular:  RRR, no m/r/g. trace LE edema.  Respiratory:   CTA bilaterally with no wheezes/rales/rhonchi.  Normal respiratory effort. Abdomen:  soft, TTP in RLQ, BS+ no rebound or guarding  Back:   normal alignment, no CVAT Skin:  no rash or induration seen on limited exam Musculoskeletal:  grossly normal tone BUE/BLE, good ROM, no bony abnormality Lower extremity:  No LE edema.  Limited foot exam with no ulcerations.  2+ distal pulses. Psychiatric:  grossly normal mood and affect, speech fluent and appropriate, AOx3 Neurologic:  CN 2-12 grossly intact, moves all extremities in coordinated fashion, sensation intact   Radiological Exams on Admission: Independently reviewed - see discussion in A/P where applicable  CT ABDOMEN PELVIS W CONTRAST Result Date: 04/23/2024 EXAM: CT ABDOMEN AND PELVIS WITH CONTRAST 04/23/2024 06:27:13 PM TECHNIQUE: CT of the abdomen and pelvis was performed with the administration of 100 mL iohexol  (OMNIPAQUE ) 300 MG/ML solution. Multiplanar reformatted images are provided for review. Automated exposure control, iterative reconstruction, and/or weight-based adjustment of the mA/kV was utilized to reduce the  radiation dose to as low as reasonably achievable. COMPARISON: None available. CLINICAL HISTORY: LLQ abdominal pain; RLQ abdominal pain FINDINGS: LOWER CHEST: Large volume hiatal hernia on the left containing the entire stomach as well as a long loop of transverse colon. LIVER: Total of 3 hypodense peripherally enhancing hepatic masses, for example, 2.7 cm lesion (2.27). GALLBLADDER AND BILE DUCTS: Gallbladder is unremarkable. No biliary ductal dilatation. SPLEEN: No acute abnormality. PANCREAS: No acute abnormality. ADRENAL GLANDS: No acute abnormality. KIDNEYS, URETERS AND BLADDER: 4 mm left nephrolithiasis. No right nephrolithiasis. No ureterolithiasis bilaterally. No hydroureteronephrosis. No perinephric or periureteral stranding. No filling defects of the partially visualized collecting systems on delayed imaging. The urinary bladder lumen is dilated with urine. GI AND BOWEL: Stomach demonstrates no acute abnormality. There is no bowel obstruction. Colonic diverticulosis. Stool within the rectum. No small or large bowel thickening or dilatation. The appendix is not definitely identified with no inflammatory changes in the right lower quadrant to suggest acute appendicitis. Questioned terminal ileum bowel wall thickening with the terminal ileum extending along the right lower quadrant mass. PERITONEUM AND RETROPERITONEUM: There is  a peripherally enhancing lobulated multiloculated mass within the right lower abdomen mesentery (2:58, 8.44). This mass abuts the terminal ileum as well as a short segment of the cecum. No ascites. No free air. VASCULATURE: Aorta is normal in caliber. LYMPH NODES: No lymphadenopathy. REPRODUCTIVE ORGANS: Status post hysterectomy. No adnexal mass. BONES AND SOFT TISSUES: Diffusely decreased bone density. No acute fracture. No suspicious lytic or blastic osseous lesions. No focal soft tissue abnormality. IMPRESSION: 1. Peripherally enhancing lobulated multiloculated right lower abdominal  mesenteric mass abutting the terminal ileum and sigmoid, with suspected terminal ileal wall thickening. Findings concerning for malignancy. 2. Three hypodense peripherally enhancing hepatic masses suggestive of metastases. 3. Large volume hiatal hernia containing the entire stomach and a long loop of transverse colon. No associated obstruction or findings to suggest ischemia. 4. Nonobstructive 4 mm left nephrolithiasis. 5. Colonic diverticulosis without evidence of diverticulitis. 6. Other, non-acute and/or normal findings as above. Electronically signed by: Morgane Naveau MD 04/23/2024 06:45 PM EST RP Workstation: [id number]     Labs on Admission: I have personally reviewed the available labs and imaging studies at the time of the admission.  Pertinent labs:   WBC: 15.8,  hgb: 11.4,  platelets 463,  Assessment and Plan: Principal Problem:   Mesenteric mass Active Problems:   Leukocytosis   Weakness   Normocytic anemia   Hiatal hernia   Constipation   Gastroesophageal reflux disease with esophagitis without hemorrhage   Mild protein malnutrition    Assessment and Plan: * Mesenteric mass 89 year old female presenting to ED with one week history of lower abdominal pain and constipation with poor PO intake and increasing weakness found to have loculated multiloculated right lower abdominal mesenteric mass abutting the terminal ileum and sigmoid with suspected terminal ileal wall thickening. Concerning for malignancy  -obs to med surg -oncology consulted in ED and will see tomorrow, may need IR but will wait on oncology recommendation  -check CEA and Ca 125 no adnexal masses seen on CT   -also has 3 hepatic masses concerning for metastases  -main concern is weakness in setting of malignancy  -gentle IVF -check B12 and Tsh  -nutrition consult -PT/OT   Weakness She has had decreased PO intake with weight loss and now significant weakness Likely due to malignancy Will check  B12/TSH Albumin 3.2 Nutrition consult PT/OT and SW   Leukocytosis No signs/symptoms of infection Likely inflammatory vs. Hemoconcentrated in setting of malignancy and poor PO intake Gentle IVF Trend   Normocytic anemia Check iron studies B12  Hiatal hernia Large volume hiatal hernia containing the entire stomach and long loop of transverse colon No evidence of obstruction  Continue PPI   Constipation No findings of constipation on CT  Has had BM today Unsure if constipated vs. Mass related effect Start with colace, IVF and escalate if needed   Gastroesophageal reflux disease with esophagitis without hemorrhage Continue PPI   Mild protein malnutrition Continue ensure  Nutrition consult     Advance Care Planning:   Code Status: Limited: Do not attempt resuscitation (DNR) -DNR-LIMITED -Do Not Intubate/DNI     Consultants: Oncology: Dr. Caroline    Procedures: CT abdomen/pelvis 11/16    Antibiotics: None     DVT Prophylaxis: SCDs  Family Communication: updated daughter by phone: [name] Agent   Severity of Illness: The appropriate patient status for this patient is OBSERVATION. Observation status is judged to be reasonable and necessary in order to provide the required intensity of service to ensure the patient's  safety. The patient's presenting symptoms, physical exam findings, and initial radiographic and laboratory data in the context of their medical condition is felt to place them at decreased risk for further clinical deterioration. Furthermore, it is anticipated that the patient will be medically stable for discharge from the hospital within 2 midnights of admission.   Author: Isaiah Geralds, MD 04/23/2024 9:42 PM  For on call review [email or web address].

## 2024-04-23 NOTE — Assessment & Plan Note (Signed)
 She has had decreased PO intake with weight loss and now significant weakness, likely due to malignancy --Fall precautions --PT/OT consulted on admission

## 2024-04-23 NOTE — Assessment & Plan Note (Signed)
 Continue PPI.

## 2024-04-23 NOTE — Assessment & Plan Note (Signed)
 Iron deficiency B12 normal --CBC to monitor

## 2024-04-23 NOTE — Assessment & Plan Note (Addendum)
 Continue ensure  Nutrition consult

## 2024-04-23 NOTE — ED Notes (Signed)
 Pt advised that she would like an ensure or boost supplement drink. She also advised she does not want to do an enema right now. Md made aware.

## 2024-04-23 NOTE — ED Triage Notes (Addendum)
 Pt arriving via EMS from [facility] for lower abdominal pain. Pt has not had a full bowel movement in approx 6 days. Lower abdomen is firm and distended upon arrival. Pt A&O x4. Pt reports decreased appetite over the last 4 days due to consistent full feeling in her belly. Pt also has hx of hiatal hernia.

## 2024-04-23 NOTE — ED Provider Notes (Signed)
 [place] EMERGENCY DEPARTMENT AT [facility] Provider Note   CSN: [id number] Arrival date & time: 04/23/24  1607     Patient presents with: Abdominal Pain   Beth Cantu is a 89 y.o. female.   Patient is a 89 year old female with a past medical history of hiatal hernia presenting to the emergency department with abdominal pain.  The patient states that she has had lower abdominal pain and constipation for the last several days.  She states that she has had very small bowel movements but states that she still feels like she has more that will not come out.  She states that she is still passing gas.  She denies any nausea or vomiting or fevers.  She states that she has not been taking any stool softeners or laxatives at home.  She denies any associated shortness of breath.  The history is provided by the patient and a relative.  Abdominal Pain      Prior to Admission medications   Medication Sig Start Date End Date Taking? Authorizing Provider  augmented betamethasone dipropionate (DIPROLENE-AF) 0.05 % cream Apply topically 2 (two) times daily as needed. 12/11/21   [provider]  Cyanocobalamin  (VITAMIN B-12 PO) Take 1 tablet by mouth daily.    [provider]  Ergocalciferol  (VITAMIN D2) 400 units TABS Take 1 tablet by mouth daily.    [provider]  Multiple Vitamins-Minerals (PRESERVISION AREDS 2 PO) Take 1 tablet by mouth daily.    [provider]  nystatin  cream (MYCOSTATIN ) Apply 1 Application topically 2 (two) times daily as needed for dry skin. 04/30/23   Jordan, Betty G, MD  ondansetron  (ZOFRAN ) 4 MG tablet Take 1 tablet (4 mg total) by mouth daily as needed for nausea. 01/27/23   Jordan, Betty G, MD  pantoprazole  (PROTONIX ) 40 MG tablet Take 1 tablet (40 mg total) by mouth daily. 06/07/23   Mansouraty, Aloha Raddle., MD    Allergies: Aspirin and Erythromycin    Review of Systems  Gastrointestinal:  Positive for abdominal pain.     Updated Vital Signs BP 130/62 (BP Location: Right Arm)   Pulse 83   Temp 97.6 F (36.4 C) (Oral)   Resp 16   SpO2 95%   Physical Exam Vitals and nursing note reviewed.  Constitutional:      General: She is not in acute distress.    Appearance: She is well-developed.  HENT:     Head: Normocephalic.     Mouth/Throat:     Mouth: Mucous membranes are moist.  Eyes:     Extraocular Movements: Extraocular movements intact.  Cardiovascular:     Rate and Rhythm: Normal rate and regular rhythm.  Pulmonary:     Breath sounds: Normal breath sounds.     Comments: Mildly tachypneic, speaking in full sentences, no increased work of breathing Abdominal:     General: Abdomen is flat.     Palpations: Abdomen is soft.     Tenderness: There is abdominal tenderness in the right lower quadrant and left lower quadrant. There is no guarding or rebound.  Skin:    General: Skin is warm and dry.  Neurological:     General: No focal deficit present.     Mental Status: She is alert and oriented to person, place, and time.  Psychiatric:        Mood and Affect: Mood normal.        Behavior: Behavior normal.     (all labs ordered are  listed, but only abnormal results are displayed) Labs Reviewed  COMPREHENSIVE METABOLIC PANEL WITH GFR - Abnormal; Notable for the following components:      Result Value   Glucose, Bld 144 (*)    BUN 28 (*)    Total Protein 6.3 (*)    Albumin 3.2 (*)    All other components within normal limits  CBC - Abnormal; Notable for the following components:   WBC 15.8 (*)    Hemoglobin 11.4 (*)    HCT 35.3 (*)    Platelets 463 (*)    All other components within normal limits  LIPASE, BLOOD  URINALYSIS, ROUTINE W REFLEX MICROSCOPIC    EKG: None  Radiology: CT ABDOMEN PELVIS W CONTRAST Result Date: 04/23/2024 EXAM: CT ABDOMEN AND PELVIS WITH CONTRAST 04/23/2024 06:27:13 PM TECHNIQUE: CT of the abdomen and pelvis was performed with the administration of 100 mL  iohexol  (OMNIPAQUE ) 300 MG/ML solution. Multiplanar reformatted images are provided for review. Automated exposure control, iterative reconstruction, and/or weight-based adjustment of the mA/kV was utilized to reduce the radiation dose to as low as reasonably achievable. COMPARISON: None available. CLINICAL HISTORY: LLQ abdominal pain; RLQ abdominal pain FINDINGS: LOWER CHEST: Large volume hiatal hernia on the left containing the entire stomach as well as a long loop of transverse colon. LIVER: Total of 3 hypodense peripherally enhancing hepatic masses, for example, 2.7 cm lesion (2.27). GALLBLADDER AND BILE DUCTS: Gallbladder is unremarkable. No biliary ductal dilatation. SPLEEN: No acute abnormality. PANCREAS: No acute abnormality. ADRENAL GLANDS: No acute abnormality. KIDNEYS, URETERS AND BLADDER: 4 mm left nephrolithiasis. No right nephrolithiasis. No ureterolithiasis bilaterally. No hydroureteronephrosis. No perinephric or periureteral stranding. No filling defects of the partially visualized collecting systems on delayed imaging. The urinary bladder lumen is dilated with urine. GI AND BOWEL: Stomach demonstrates no acute abnormality. There is no bowel obstruction. Colonic diverticulosis. Stool within the rectum. No small or large bowel thickening or dilatation. The appendix is not definitely identified with no inflammatory changes in the right lower quadrant to suggest acute appendicitis. Questioned terminal ileum bowel wall thickening with the terminal ileum extending along the right lower quadrant mass. PERITONEUM AND RETROPERITONEUM: There is a peripherally enhancing lobulated multiloculated mass within the right lower abdomen mesentery (2:58, 8.44). This mass abuts the terminal ileum as well as a short segment of the cecum. No ascites. No free air. VASCULATURE: Aorta is normal in caliber. LYMPH NODES: No lymphadenopathy. REPRODUCTIVE ORGANS: Status post hysterectomy. No adnexal mass. BONES AND SOFT  TISSUES: Diffusely decreased bone density. No acute fracture. No suspicious lytic or blastic osseous lesions. No focal soft tissue abnormality. IMPRESSION: 1. Peripherally enhancing lobulated multiloculated right lower abdominal mesenteric mass abutting the terminal ileum and sigmoid, with suspected terminal ileal wall thickening. Findings concerning for malignancy. 2. Three hypodense peripherally enhancing hepatic masses suggestive of metastases. 3. Large volume hiatal hernia containing the entire stomach and a long loop of transverse colon. No associated obstruction or findings to suggest ischemia. 4. Nonobstructive 4 mm left nephrolithiasis. 5. Colonic diverticulosis without evidence of diverticulitis. 6. Other, non-acute and/or normal findings as above. Electronically signed by: Morgane Naveau MD 04/23/2024 06:45 PM EST RP Workstation: [id number]     Procedures   Medications Ordered in the ED  sodium phosphate (FLEET) enema 1 enema (has no administration in time range)  iohexol  (OMNIPAQUE ) 300 MG/ML solution 100 mL (100 mLs Intravenous Contrast Given 04/23/24 1816)    Clinical Course as of 04/23/24 2020  Sun Apr 23, 2024  1734 Labs  with leukocytosis, otherwise no significant abnormality. CT and UA pending. [VK]  1933 CTAP with concerns of loculated mass concerning for malignancy with hepatic mets.  [VK]  2014 Spoke with patient and daughter now at bedside. States patient has hardly been eating and getting progressively more weak having difficulty walking. Do not feel like they could get her to outpatient appointments. Will admit for oncology eval and management of weakness. [VK]  2019 I spoke with oncology, Dr. Autumn, who will see in the AM, states likely will need IR drainage for evaluation.  [VK]    Clinical Course User Index [VK] Kingsley, Tamarion Haymond K, DO                                 Medical Decision Making This patient presents to the ED with chief complaint(s) of abdominal pain  with pertinent past medical history of hiatal hernia which further complicates the presenting complaint. The complaint involves an extensive differential diagnosis and also carries with it a high risk of complications and morbidity.    The differential diagnosis includes constipation, UTI, appendicitis, diverticulitis, other intra-abdominal infection, SBO less likely as she is still having bowel movements and passing gas and has normal bowel sounds  Additional history obtained: Additional history obtained from family Records reviewed Primary Care Documents  ED Course and Reassessment: On patient's arrival she is mildly tachypneic but hemodynamically stable in no acute distress.  Her abdomen is soft with bowel sounds but is tender across the lower abdomen.  Will have labs, urine and CT scan performed.  If symptoms appear secondary to constipation will be given here.  She declined any pain control at this time will be closely reassessed.  Independent labs interpretation:  The following labs were independently interpreted: leukocytosis, otherwise labs within normal range  Independent visualization of imaging: - I independently visualized the following imaging with scope of interpretation limited to determining acute life threatening conditions related to emergency care: CTAP, which revealed multiloculated mass concerning for malignancy with concern for hepatic metastasis, stool ball in rectum   Consultation: - Consulted or discussed management/test interpretation w/ external professional: oncology, hospitalist  Consideration for admission or further workup: patient requires admission for weakness and new cancer Social Determinants of health: N/A    Amount and/or Complexity of Data Reviewed Labs: ordered. Radiology: ordered.  Risk OTC drugs. Prescription drug management. Decision regarding hospitalization.       Final diagnoses:  Intraabdominal mass  Constipation, unspecified  constipation type    ED Discharge Orders     None          Kingsley, Allora Bains K, DO 04/23/24 2020

## 2024-04-23 NOTE — ED Notes (Signed)
 This Nurse received pt with daughter at bedside, pt aaox4. Pt requested water this nurse gave pt some water to drink. Tolerated well. Bed set to lowest setting. Call bell at bedside and room door left open.

## 2024-04-23 NOTE — Assessment & Plan Note (Addendum)
Bowel regimen per orders 

## 2024-04-23 NOTE — Assessment & Plan Note (Signed)
 Large volume hiatal hernia containing the entire stomach and long loop of transverse colon.  No evidence of obstruction  --Continue PPI

## 2024-04-23 NOTE — Assessment & Plan Note (Signed)
 No signs/symptoms of infection, afebrile. Likely inflammatory vs. hemoconcentrated in setting of malignancy and poor PO intake --Will continue gentle gentle IVF --Monitor CBC & clinically for s/sx's of infection

## 2024-04-23 NOTE — Assessment & Plan Note (Addendum)
 89 year old female presenting to ED with one week history of lower abdominal pain and constipation with poor PO intake and increasing weakness found to have loculated multiloculated right lower abdominal mesenteric mass abutting the terminal ileum and sigmoid with suspected terminal ileal wall thickening. Liver lesions also present concerning for metastatic disease. --Oncology & Palliative care consulted --Plan is for Hospice on discharge  --TOC following --Goal is for pain & symptom control --DNR/DNI

## 2024-04-24 DIAGNOSIS — R1900 Intra-abdominal and pelvic swelling, mass and lump, unspecified site: Principal | ICD-10-CM | POA: Diagnosis present

## 2024-04-24 DIAGNOSIS — C787 Secondary malignant neoplasm of liver and intrahepatic bile duct: Secondary | ICD-10-CM | POA: Diagnosis present

## 2024-04-24 DIAGNOSIS — K6389 Other specified diseases of intestine: Secondary | ICD-10-CM | POA: Diagnosis not present

## 2024-04-24 LAB — BASIC METABOLIC PANEL WITH GFR
Anion gap: 9 (ref 5–15)
BUN: 26 mg/dL — ABNORMAL HIGH (ref 8–23)
CO2: 25 mmol/L (ref 22–32)
Calcium: 8.7 mg/dL — ABNORMAL LOW (ref 8.9–10.3)
Chloride: 102 mmol/L (ref 98–111)
Creatinine, Ser: 0.47 mg/dL (ref 0.44–1.00)
GFR, Estimated: >60 mL/min (ref >60)
Glucose, Bld: 220 mg/dL — ABNORMAL HIGH (ref 70–99)
Potassium: 4.1 mmol/L (ref 3.5–5.1)
Sodium: 136 mmol/L (ref 135–145)

## 2024-04-24 LAB — CBC
HCT: 31.3 % — ABNORMAL LOW (ref 36.0–46.0)
Hemoglobin: 10.2 g/dL — ABNORMAL LOW (ref 12.0–15.0)
MCH: 28.6 pg (ref 26.0–34.0)
MCHC: 32.6 g/dL (ref 30.0–36.0)
MCV: 87.7 fL (ref 80.0–100.0)
Platelets: 428 K/uL — ABNORMAL HIGH (ref 150–400)
RBC: 3.57 MIL/uL — ABNORMAL LOW (ref 3.87–5.11)
RDW: 13.2 % (ref 11.5–15.5)
WBC: 15.9 K/uL — ABNORMAL HIGH (ref 4.0–10.5)
nRBC: 0 % (ref 0.0–0.2)

## 2024-04-24 LAB — TSH: TSH: 0.469 u[IU]/mL (ref 0.350–4.500)

## 2024-04-24 MED ORDER — PANTOPRAZOLE SODIUM 40 MG PO TBEC
40.0000 mg | DELAYED_RELEASE_TABLET | Freq: Every day | ORAL | Status: DC
  Administered 2024-04-24 – 2024-04-26 (×3): 40 mg via ORAL
  Filled 2024-04-24 (×4): qty 1

## 2024-04-24 MED ORDER — PSYLLIUM 95 % PO PACK
1.0000 | PACK | Freq: Every day | ORAL | Status: DC
  Administered 2024-04-25 – 2024-04-26 (×2): 1 via ORAL
  Filled 2024-04-24 (×3): qty 1

## 2024-04-24 MED ORDER — BISACODYL 10 MG RE SUPP
10.0000 mg | Freq: Every day | RECTAL | Status: DC | PRN
Start: 2024-04-24 — End: 2024-04-27
  Administered 2024-04-24 – 2024-04-27 (×2): 10 mg via RECTAL
  Filled 2024-04-24 (×2): qty 1

## 2024-04-24 MED ORDER — ADULT MULTIVITAMIN W/MINERALS CH
1.0000 | ORAL_TABLET | Freq: Every day | ORAL | Status: DC
  Administered 2024-04-24 – 2024-04-26 (×3): 1 via ORAL
  Filled 2024-04-24 (×4): qty 1

## 2024-04-24 MED ORDER — WOMENS MULTIVITAMIN PO TABS: 1.0000 | ORAL_TABLET | Freq: Every day | ORAL | Status: DC

## 2024-04-24 MED ORDER — HYDROXYZINE HCL 10 MG PO TABS
5.0000 mg | ORAL_TABLET | Freq: Once | ORAL | Status: AC | PRN
  Administered 2024-04-26: 5 mg via ORAL
  Filled 2024-04-24 (×2): qty 1

## 2024-04-24 NOTE — Consult Note (Signed)
 Palliative Medicine Inpatient Consult Note  Consulting Provider: Dr. Christobal  Reason for consult:   Palliative Care Consult Services Palliative Medicine Consult  Reason for Consult? GOC-suspected intra-abdominal malignancy with liver mets, failure to thrive   04/24/2024  HPI:  Per intake H&P -->  Beth Cantu is a 89 y.o. female with medical history significant of HLD, GERD, hx of TIA, hiatal hernia varicose veins, venous stasis who presented to ED with lower abdominal pain and constipation over the past week. Also has had decreased appetite and increased fatigue with significant weakness. Palliative care asked to support goals of care conversations in the setting of an intraabdominal mass.   Clinical Assessment/Goals of Care:  *Please note that this is a verbal dictation therefore any spelling or grammatical errors are due to the Dragon Medical One system interpretation.  I have reviewed medical records including EPIC notes, labs and imaging, received report from bedside RN, assessed the patient who is lying in bed fully coherent in NAD.    I met with Beth Cantu and her daughter, Beth Cantu to further discuss diagnosis prognosis, GOC, EOL wishes, disposition and options.   I introduced Palliative Medicine as specialized medical care for people living with serious illness. It focuses on providing relief from the symptoms and stress of a serious illness. The goal is to improve quality of life for both the patient and the family.  Medical History Review and Understanding:  A review of Beth Cantu's past medical history significant for HLD, GERD, hx of TIA, hiatal hernia varicose veins, venous stasis, and chronic back pain.   Social History:  And shares that she has been married twice she divorced her first husband and fell deeply in love with her second one, she considers him the true love of her life. She has one daughter and one grandson. 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 She is a woman  who enjoys being independent. She shares enjoying traveling throughout her life, dancing, and maintaining good social connections. She is a woman of the methodist denomination and notes a strong faith.   Functional and Nutritional State:  Preceding hospitalization and had been well-functioning up until 8 days ago when she started falling and fell roughly 4 times in 3 days.  She has since then required a CNA for 8 hours a day checking on her every 2 hours to see if she had to use the restroom.  She has been able to mobilize and perform B ADLs.  She had remained to have fair nutritional intake until recently -presently she is eating little to nothing.  Advance Directives:  A detailed discussion was had today regarding advanced directives.  And shares that she does have advanced directives and her surrogate decision maker is her daughter, Beth Cantu.   Code Status:  Concepts specific to code status, artifical feeding and hydration, continued IV antibiotics and rehospitalization was had.  The difference between a aggressive medical intervention path  and a palliative comfort care path for this patient at this time was had.   Beth Cantu shares she does not want CPR, shocks, or intubation and confirms the desire to be a DNAR/DNI code status.   Discussion:  A review of the circumstances leading to hospitalization inclusive of recent falls, weakness, adult failure to thrive, and abdominal pain were completed.  We discussed her decline over the past few days and how it has been quite rapid.  We reviewed the imaging studies and the identified intra-abdominal mass which has been present on radiographic  imaging.  And shares the desire to at least understand what the mass is.  She would be willing to go through potential biopsy to gain better understanding.  She is interested in getting all the pieces of information from the interventional radiologist, surgeon, oncologist to help her make decisions moving forward.  We  discussed the concern associated with Beth Cantu advanced age as well as her hiatal hernia in the context of potentially trying to obtain a biopsy.  We discussed Beth Cantu's adult failure to thrive also in relation to her hiatal hernia and how little she is likely to be able to take it as a result of this.  We reviewed if she meets with all the specialists and none of them feel she is a good candidate for advanced therapies the idea of hospice.I described hospice as a service for patients who have a life expectancy of 6 months or less. The goal of hospice is the preservation of dignity and quality at the end phases of life. Under hospice care, the focus changes from curative to symptom relief.   And at this time again clarifies wanting to know more information and having her options laid out for her to make a well-informed decision.  She and her daughter agree to speak among themselves once more information is obtained.  Discussed the importance of continued conversation with family and their  medical providers regarding overall plan of care and treatment options, ensuring decisions are within the context of the patients values and GOCs.  Decision Maker: Beth Cantu (Daughter): [phone] (Mobile)   SUMMARY OF RECOMMENDATIONS   DNAR/DNI  Patient is hopeful to understand information regarding the intra-abdominal mass and what her options are moving forward  Gently broached the topic of hospice should the recommendations be to not pursue further diagnostic workup or intervention  Plan at this time is for patient and her daughter to gather more information and discussed that information among themselves to determine the best path moving forward  Ongoing palliative care support  Code Status/Advance Care Planning: DNAR/DNI   Palliative Prophylaxis:  Aspiration, Bowel Regimen, Delirium Protocol, Frequent Pain Assessment, Oral Care, Palliative Wound Care, and Turn Reposition  Additional Recommendations  (Limitations, Scope, Preferences): Continue current care  Psycho-social/Spiritual:  Desire for further Chaplaincy support: Yes Additional Recommendations: Education on possible intervention decisions associated with intra-abdominal mass   Prognosis: Unclear at this time  Discharge Planning: Discharge plan to be determined  Vitals:   04/24/24 0320 04/24/24 0723  BP: (!) 154/68 (!) 153/67  Pulse: 75 80  Resp: 18 15  Temp: 97.8 F (36.6 C) 97.7 F (36.5 C)  SpO2: 94% 93%    Intake/Output Summary (Last 24 hours) at 04/24/2024 0851 Last data filed at 04/24/2024 0448 Gross per 24 hour  Intake 461.25 ml  Output --  Net 461.25 ml   Last Weight  Most recent update: 04/23/2024 11:22 PM    Weight  55.1 kg (121 lb 7.6 oz)             LABS: CBC:    Component Value Date/Time   WBC 15.9 (H) 04/24/2024 0100   HGB 10.2 (L) 04/24/2024 0100   HCT 31.3 (L) 04/24/2024 0100   PLT 428 (H) 04/24/2024 0100   MCV 87.7 04/24/2024 0100   NEUTROABS 10.1 (H) 12/22/2021 0536   LYMPHSABS 0.6 (L) 12/22/2021 0536   MONOABS 0.4 12/22/2021 0536   EOSABS 0.0 12/22/2021 0536   BASOSABS 0.0 12/22/2021 0536   Comprehensive Metabolic Panel:    Component  Value Date/Time   NA 136 04/24/2024 0100   K 4.1 04/24/2024 0100   CL 102 04/24/2024 0100   CO2 25 04/24/2024 0100   BUN 26 (H) 04/24/2024 0100   CREATININE 0.47 04/24/2024 0100   GLUCOSE 220 (H) 04/24/2024 0100   CALCIUM 8.7 (L) 04/24/2024 0100   AST 18 04/23/2024 1637   ALT 11 04/23/2024 1637   ALKPHOS 77 04/23/2024 1637   BILITOT 0.8 04/23/2024 1637   PROT 6.3 (L) 04/23/2024 1637   ALBUMIN 3.2 (L) 04/23/2024 1637    Gen: Frail elderly Caucasian female HEENT: moist mucous membranes CV: Regular rate and rhythm  PULM: On room air breathing is even and unlabored ABD: Abdominal tenderness EXT: Thin extremities Neuro: Alert and oriented x3   PPS: 40-50%   This conversation/these recommendations were discussed with patient  primary care team, Dr. Christobal ______________________________________________________ Rosaline Becton [facility] Palliative Medicine Team Team Cell Phone: [phone] Please utilize secure chat with additional questions, if there is no response within 30 minutes please call the above phone number  Total Time: 75 Billing based on MDM: High  Palliative Medicine Team providers are available by phone from 7am to 7pm daily and can be reached through the team cell phone.  Should this patient require assistance outside of these hours, please call the patient's attending physician.

## 2024-04-24 NOTE — Progress Notes (Signed)
 Patient has been very anxious since admission on unit. Patient was positioned in bed, but soon called out for discomfort. Patient requested pillows be under her and the head of the bed be lifted. But, the patient was still complaining of discomfort. Provider was messaged to ask about something to help manage anxiety and patient was offered some pain medication she has in her MAR. Patient refused both. The patient was educated on the benefits since she stated I am In pain. I have not slept in days and I do not feel well. Patient stated during medication refusal My doctor said to not take anything unless I have to, so I will not take anything. Patient adjusted in bed, pillows put under her comfort, and patients door is open.

## 2024-04-24 NOTE — Progress Notes (Signed)
 PT Cancellation Note  Patient Details Name: Beth Cantu MRN: [id number] DOB: December 25, 1924   Cancelled Treatment:    Reason Eval/Treat Not Completed: Other (comment)  Pt declined due to not feeling well and wanting to rest.  Will f/u as able.  Benjiman, PT Acute Rehab [facility] [phone]  Benjiman VEAR Mulberry 04/24/2024, 12:22 PM

## 2024-04-24 NOTE — Care Management Obs Status (Signed)
 MEDICARE OBSERVATION STATUS NOTIFICATION   Patient Details  Name: Beth Cantu MRN: [id number] Date of Birth: 01/19/1925   Medicare Observation Status Notification Given:  Yes    Rosemary Mossbarger A Porsha Skilton, LCSW 04/24/2024, 2:00 PM

## 2024-04-24 NOTE — Progress Notes (Signed)
 Beth Cantu has a large stool load at her rectum opening but is unable to push it out. I disimpacted her for a large amount mixed with blood, stool was brown. She still has a large amount of stool left in her. Beth Cantu needed a rest. She keeps oozing stool. She was cleaned several times and barrier cream was applied. Dr. Mennie notified about the blood in the stool. SCD's ordered.

## 2024-04-24 NOTE — Consult Note (Addendum)
 ADDENDUM:  Patient was personally and independently interviewed, examined and relevant elements of the history of present illness were reviewed in details and an assessment and plan was created. All elements of the patient's history of present illness, assessment and plan were discussed in detail with Olam JINNY Brunner, NP. The above documentation reflects our combined findings assessment and plan.   Briefly, 89 year old lady who was admitted to the hospital on 04/23/2024 after she presented with abdominal pain.  Imaging indicative of lower abdominal mesenteric mass and liver mets, concerning for malignancy.  Our service was consulted for additional recommendations.  Today I had detailed discussion with the patient.  Previously patient wanted to pursue full scope of workup and management.  Explained to her that she has stage IV malignancy, unless proven otherwise.  All treatment options will be palliative in nature and treatment would involve chemotherapy.  Not a candidate for surgery or radiation because of liver lesions indicating metastatic disease.  Given her age, medical comorbidities, she would be a poor candidate for systemic chemotherapy as risks outweigh benefits.  Following our discussion, patient understands the gravity of her working diagnosis.  She agrees that pursuing additional workup and management may not be a good idea.  She is clear about not getting chemotherapy as she understands that it can make her very sick and may actually kill her.  She wants to think about whether she would like to pursue biopsy or not and then let the primary team know.  In case she does still want to proceed with workup, IR guided biopsy of liver lesion would be the next step.  Please call us  with any additional questions or concerns.  No additional recommendations from oncology standpoint at this time.  Will sign off.  [facility] CONSULT NOTE  Patient Care Team: Jordan, Betty G, MD as PCP -  General (Family Medicine) Claudene Victory ORN, MD (Inactive) as PCP - Cardiology (Cardiology)  CHIEF COMPLAINTS/PURPOSE OF CONSULTATION:  Mesenteric mass  REFERRING PHYSICIAN: Dr. Mennie Lamy  HISTORY OF PRESENTING ILLNESS:  Beth Cantu 89 y.o. female who was admitted on 04/23/2024 with complaints of abdominal pain.  Workup was done including imaging which showed right lower abdominal mesenteric mass concerning for malignancy.  Therefore oncology evaluation has been requested. Patient seen awake and alert laying in bed.  She is pale, and weak appearing.  She is oriented to self and aware that she is in [facility] hospital.  Patient admits to right-sided abdominal pain although states it is not too bad.  Patient was brought in from [facility] assisted living due to abdominal pain and lack of bowel movement in 6 days.  States that she ambulates with walker Medical history includes hypertension, hyperlipidemia, hiatal hernia, TIA. Surgical history is noncontributory. Family oncologic history is noncontributory. Social history is noncontributory: Denies tobacco use, denies alcohol use, denies recreational or illicit drug use.  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  Currently lives at assisted living facility.     I have reviewed her chart and materials related to her cancer extensively and collaborated history with the patient. Summary of oncologic history is as follows: Oncology History   No history exists.    ASSESSMENT & PLAN:  Mesenteric mass with hepatic mets - Imaging done 04/23/2024 showed multiloculated right lower abdominal mesenteric mass with suspected terminal ileal wall thickening.  Concerning for malignancy.  There are also hepatic masses. - Patient confirms that she would like  a biopsy of the mass.  Asked if there is any noninvasive treatment if it is cancer. - Recommend IR evaluation for biopsy. - Tumor markers remain pending,  will follow results - Recommend Palliative eval for goals of care - Medical oncology/Dr. Richel Millspaugh will make further evaluation recommendations.  Abdominal pain Constipation - Patient complains of ongoing abdominal pain and constipation - Likely secondary to malignancy - Continue bowel regimen as ordered - Continue supportive care  Anemia, normocytic - Hemoglobin 11.4 on admission.  Decreased to 10.2 today, may be dilution effect. - No transfusional intervention required at this time - Continue to monitor CBC with differential  Thrombocytosis - Likely reactive - Platelets 428K     MEDICAL HISTORY:  Past Medical History:  Diagnosis Date   Back pain    Hyperlipidemia    Varicose veins     SURGICAL HISTORY: Past Surgical History:  Procedure Laterality Date   BIOPSY  12/22/2021   Procedure: BIOPSY;  Surgeon: Wilhelmenia Aloha Raddle., MD;  Location: [facility];  Service: Gastroenterology;;   ESOPHAGOGASTRODUODENOSCOPY (EGD) WITH PROPOFOL  N/A 12/22/2021   Procedure: ESOPHAGOGASTRODUODENOSCOPY (EGD) WITH PROPOFOL ;  Surgeon: Wilhelmenia Aloha Raddle., MD;  Location: [facility];  Service: Gastroenterology;  Laterality: N/A;    SOCIAL HISTORY: Social History   Socioeconomic History   Marital status: Widowed    Spouse name: Not on file   Number of children: Not on file   Years of education: Not on file   Highest education level: Not on file  Occupational History   Not on file  Tobacco Use   Smoking status: Never   Smokeless tobacco: Never  Vaping Use   Vaping status: Never Used  Substance and Sexual Activity   Alcohol use: No    Alcohol/week: 0.0 standard drinks of alcohol   Drug use: No   Sexual activity: Not Currently  Other Topics Concern   Not on file  Social History Narrative   Not on file   Social Drivers of Health   Financial Resource Strain: Low Risk  (11/04/2023)   Overall Financial Resource Strain (CARDIA)    Difficulty of Paying Living Expenses: Not  hard at all  Food Insecurity: No Food Insecurity (04/23/2024)   Hunger Vital Sign    Worried About Running Out of Food in the Last Year: Never true    Ran Out of Food in the Last Year: Never true  Transportation Needs: No Transportation Needs (04/23/2024)   PRAPARE - Administrator, Civil Service (Medical): No    Lack of Transportation (Non-Medical): No  Physical Activity: Inactive (11/04/2023)   Exercise Vital Sign    Days of Exercise per Week: 0 days    Minutes of Exercise per Session: 0 min  Stress: No Stress Concern Present (11/04/2023)   Beth Cantu [employer] Occupational Health - Occupational Stress Questionnaire    Feeling of Stress : Not at all  Social Connections: Socially Isolated (04/23/2024)   Social Connection and Isolation Panel    Frequency of Communication with Friends and Family: More than three times a week    Frequency of Social Gatherings with Friends and Family: Once a week    Attends Religious Services: Never    Database Administrator or Organizations: No    Attends Banker Meetings: Never    Marital Status: Widowed  Intimate Partner Violence: Not At Risk (04/23/2024)   Humiliation, Afraid, Rape, and Kick questionnaire    Fear of Current or Ex-Partner: No    Emotionally  Abused: No    Physically Abused: No    Sexually Abused: No    FAMILY HISTORY: Family History  Problem Relation Age of Onset   Cancer Father        bladder   Colon cancer Neg Hx    Esophageal cancer Neg Hx    Inflammatory bowel disease Neg Hx    Liver disease Neg Hx    Pancreatic cancer Neg Hx    Rectal cancer Neg Hx    Stomach cancer Neg Hx      PHYSICAL EXAMINATION: ECOG PERFORMANCE STATUS: 3 - Symptomatic, >50% confined to bed  Vitals:   04/24/24 0320 04/24/24 0723  BP: (!) 154/68 (!) 153/67  Pulse: 75 80  Resp: 18 15  Temp: 97.8 F (36.6 C) 97.7 F (36.5 C)  SpO2: 94% 93%   Filed Weights   04/23/24 2320  Weight: 121 lb 7.6 oz (55.1 kg)     GENERAL: alert, no distress and comfortable + elderly + ill-appearing SKIN: + Pale skin color, texture, turgor are normal, no rashes or significant lesions EYES: normal, conjunctiva are pink and non-injected, sclera clear OROPHARYNX: no exudate, no erythema and lips, buccal mucosa, and tongue normal  NECK: supple, thyroid  normal size, non-tender, without nodularity LYMPH: no palpable lymphadenopathy in the cervical, axillary or inguinal LUNGS: clear to auscultation and percussion with normal breathing effort HEART: regular rate & rhythm and no murmurs and no lower extremity edema ABDOMEN: abdomen soft, non-tender and normal bowel sounds MUSCULOSKELETAL: + Ambulates with assist PSYCH: alert & oriented x 2 with fluent speech NEURO: no focal motor/sensory deficits   ALLERGIES:  is allergic to aspirin and erythromycin.  MEDICATIONS:  Current Facility-Administered Medications  Medication Dose Route Frequency Provider Last Rate Last Admin   0.9 %  sodium chloride  infusion   Intravenous Continuous Waddell Rake, MD 75 mL/hr at 04/23/24 2239 New Bag at 04/23/24 2239   acetaminophen  (TYLENOL ) tablet 650 mg  650 mg Oral Q6H PRN Waddell Rake, MD       Or   acetaminophen  (TYLENOL ) suppository 650 mg  650 mg Rectal Q6H PRN Waddell Rake, MD       docusate sodium (COLACE) capsule 100 mg  100 mg Oral Daily Waddell Rake, MD       feeding supplement (ENSURE PLUS HIGH PROTEIN) liquid 237 mL  237 mL Oral BID BM Waddell Rake, MD       HYDROcodone-acetaminophen  (NORCO/VICODIN) 5-325 MG per tablet 1-2 tablet  1-2 tablet Oral Q4H PRN Waddell Rake, MD       hydrOXYzine (ATARAX) tablet 5 mg  5 mg Oral Once PRN Daniels, James K, NP       multivitamin with minerals tablet 1 tablet  1 tablet Oral Daily Waddell Rake, MD       ondansetron  (ZOFRAN ) tablet 4 mg  4 mg Oral Q6H PRN Waddell Rake, MD       Or   ondansetron  (ZOFRAN ) injection 4 mg  4 mg Intravenous Q6H PRN Waddell Rake, MD        pantoprazole  (PROTONIX ) EC tablet 40 mg  40 mg Oral Daily Waddell Rake, MD       psyllium (HYDROCIL/METAMUCIL) 1 packet  1 packet Oral Daily Waddell Rake, MD       sodium phosphate (FLEET) enema 1 enema  1 enema Rectal Once Kingsley, Victoria K, DO         LABORATORY DATA:  I have reviewed the data as listed Lab Results  Component Value Date  WBC 15.9 (H) 04/24/2024   HGB 10.2 (L) 04/24/2024   HCT 31.3 (L) 04/24/2024   MCV 87.7 04/24/2024   PLT 428 (H) 04/24/2024   Recent Labs    01/05/24 1148 04/23/24 1637 04/24/24 0100  NA 138 145 136  K 4.1 4.6 4.1  CL 101 104 102  CO2 30 29 25   GLUCOSE 125* 144* 220*  BUN 17 28* 26*  CREATININE 0.53 0.51 0.47  CALCIUM 9.3 9.5 8.7*  GFRNONAA  --  >60 >60  PROT 6.3 6.3*  --   ALBUMIN 3.8 3.2*  --   AST 20 18  --   ALT 12 11  --   ALKPHOS 63 77  --   BILITOT 1.1 0.8  --     RADIOGRAPHIC STUDIES: I have personally reviewed the radiological images as listed and agreed with the findings in the report. CT ABDOMEN PELVIS W CONTRAST Result Date: 04/23/2024 EXAM: CT ABDOMEN AND PELVIS WITH CONTRAST 04/23/2024 06:27:13 PM TECHNIQUE: CT of the abdomen and pelvis was performed with the administration of 100 mL iohexol  (OMNIPAQUE ) 300 MG/ML solution. Multiplanar reformatted images are provided for review. Automated exposure control, iterative reconstruction, and/or weight-based adjustment of the mA/kV was utilized to reduce the radiation dose to as low as reasonably achievable. COMPARISON: None available. CLINICAL HISTORY: LLQ abdominal pain; RLQ abdominal pain FINDINGS: LOWER CHEST: Large volume hiatal hernia on the left containing the entire stomach as well as a long loop of transverse colon. LIVER: Total of 3 hypodense peripherally enhancing hepatic masses, for example, 2.7 cm lesion (2.27). GALLBLADDER AND BILE DUCTS: Gallbladder is unremarkable. No biliary ductal dilatation. SPLEEN: No acute abnormality. PANCREAS: No acute abnormality.  ADRENAL GLANDS: No acute abnormality. KIDNEYS, URETERS AND BLADDER: 4 mm left nephrolithiasis. No right nephrolithiasis. No ureterolithiasis bilaterally. No hydroureteronephrosis. No perinephric or periureteral stranding. No filling defects of the partially visualized collecting systems on delayed imaging. The urinary bladder lumen is dilated with urine. GI AND BOWEL: Stomach demonstrates no acute abnormality. There is no bowel obstruction. Colonic diverticulosis. Stool within the rectum. No small or large bowel thickening or dilatation. The appendix is not definitely identified with no inflammatory changes in the right lower quadrant to suggest acute appendicitis. Questioned terminal ileum bowel wall thickening with the terminal ileum extending along the right lower quadrant mass. PERITONEUM AND RETROPERITONEUM: There is a peripherally enhancing lobulated multiloculated mass within the right lower abdomen mesentery (2:58, 8.44). This mass abuts the terminal ileum as well as a short segment of the cecum. No ascites. No free air. VASCULATURE: Aorta is normal in caliber. LYMPH NODES: No lymphadenopathy. REPRODUCTIVE ORGANS: Status post hysterectomy. No adnexal mass. BONES AND SOFT TISSUES: Diffusely decreased bone density. No acute fracture. No suspicious lytic or blastic osseous lesions. No focal soft tissue abnormality. IMPRESSION: 1. Peripherally enhancing lobulated multiloculated right lower abdominal mesenteric mass abutting the terminal ileum and sigmoid, with suspected terminal ileal wall thickening. Findings concerning for malignancy. 2. Three hypodense peripherally enhancing hepatic masses suggestive of metastases. 3. Large volume hiatal hernia containing the entire stomach and a long loop of transverse colon. No associated obstruction or findings to suggest ischemia. 4. Nonobstructive 4 mm left nephrolithiasis. 5. Colonic diverticulosis without evidence of diverticulitis. 6. Other, non-acute and/or normal  findings as above. Electronically signed by: Morgane Naveau MD 04/23/2024 06:45 PM EST RP Workstation: [id number]     The total time spent in the appointment was 55 minutes encounter with patients including review of chart and various tests results, discussions  about plan of care and coordination of care plan   All questions were answered. The patient knows to call the clinic with any problems, questions or concerns. No barriers to learning was detected.  Olam PARAS Rouson, NP 11/17/202511:00 AM

## 2024-04-24 NOTE — Hospital Course (Addendum)
 Brief Narrative:   89 year old with history of HLD, GERD, TIA, hiatal hernia, varicose veins, venous stasis comes to the ED with lower abdominal pain and constipation.  Workup revealed large mesenteric mass abutting terminal ileum and sigmoid colon with liver lesions.  Oncology, palliative care and IR were consulted.  After discussions with the family, patient has been transition to hospice care.  At this time no real advance treatments are really an option for her given her advanced age and frailty.  Eventually patient is being transferred to hospice.  Assessment & Plan:   Mesenteric mass with metastatic liver lesion Chronic pain Given her advanced age, frailty she is not really a candidate for any advanced therapies.  No biopsies have been planned.  Family to stay in pursuing hospice therefore we will transition the patient today.  Scheduled pain medications including Tylenol  and tramadol has been ordered.  Additional narcotics as necessary   Weakness Supportive care   Leukocytosis Reactive, will hold off on any further lab work   Normocytic anemia Iron deficiency B12 normal --CBC to monitor   Hiatal hernia Large volume hiatal hernia containing the entire stomach and long loop of transverse colon.  No evidence of obstruction  --Continue PPI    Constipation Bowel regimen per orders   Gastroesophageal reflux disease with esophagitis without hemorrhage Continue PPI    Mild protein malnutrition Continue ensure  Nutrition consult    Palliative care meeting this afternoon .  DVT prophylaxis: SCDs Start: 04/23/24 2131      Code Status: Limited: Do not attempt resuscitation (DNR) -DNR-LIMITED -Do Not Intubate/DNI  Family Communication:   Status is: Inpatient Remains inpatient appropriate because: Discharge   PT Follow up Recs: Skilled Nursing-Short Term Rehab (<3 Hours/Day)04/25/2024 1136  Subjective: Met with the patient and family at bedside.  Hospice arrangements  have been made.  No complaints  Examination:  General exam: Appears calm and comfortable  Respiratory system: Clear to auscultation. Respiratory effort normal. Cardiovascular system: S1 & S2 heard, RRR. No JVD, murmurs, rubs, gallops or clicks. No pedal edema. Gastrointestinal system: Abdomen is nondistended, soft and nontender. No organomegaly or masses felt. Normal bowel sounds heard. Central nervous system: unable to assess.  Extremities: Symmetric 5 x 5 power. Skin: No rashes, lesions or ulcers Psychiatry: unable to assess

## 2024-04-24 NOTE — Progress Notes (Signed)
 OT Cancellation Note  Patient Details Name: Beth Cantu MRN: [id number] DOB: 04-24-1925   Cancelled Treatment:    Reason Eval/Treat Not Completed:  (Pt declined due to feeling poorly and fatigue.)  Kennth Mliss Helling 04/24/2024, 1:31 PM Mliss HERO, OTR/L Acute Rehabilitation Services Office: [phone]

## 2024-04-24 NOTE — Plan of Care (Signed)
  Problem: Coping: Goal: Level of anxiety will decrease Outcome: Progressing   Problem: Pain Managment: Goal: General experience of comfort will improve and/or be controlled Outcome: Progressing   Problem: Skin Integrity: Goal: Risk for impaired skin integrity will decrease Outcome: Progressing

## 2024-04-24 NOTE — Plan of Care (Signed)
  Problem: Education: Goal: Knowledge of General Education information will improve Description: Including pain rating scale, medication(s)/side effects and non-pharmacologic comfort measures Outcome: Progressing   Problem: Clinical Measurements: Goal: Ability to maintain clinical measurements within normal limits will improve Outcome: Progressing Goal: Will remain free from infection Outcome: Progressing Goal: Diagnostic test results will improve Outcome: Progressing   Problem: Nutrition: Goal: Adequate nutrition will be maintained Outcome: Progressing   Problem: Coping: Goal: Level of anxiety will decrease Outcome: Progressing   Problem: Elimination: Goal: Will not experience complications related to bowel motility Outcome: Progressing Goal: Will not experience complications related to urinary retention Outcome: Progressing   Problem: Pain Managment: Goal: General experience of comfort will improve and/or be controlled Outcome: Progressing   Problem: Safety: Goal: Ability to remain free from injury will improve Outcome: Progressing   Problem: Skin Integrity: Goal: Risk for impaired skin integrity will decrease Outcome: Progressing

## 2024-04-24 NOTE — TOC Initial Note (Signed)
 Transition of Care [facility]) - Initial/Assessment Note    Patient Details  Name: Beth Cantu MRN: [id number] Date of Birth: [birth year]  Transition of Care [facility]) CM/SW Contact:    Heather DELENA Saltness, LCSW Phone Number: 04/24/2024, 2:02 PM  Clinical Narrative:                 Pt admitted from [facility] due to lower abdominal pain, constipation, decreased appetite, increased fatigue, and weakness. CSW spoke with pt's daughter, [name] Agent [phone], via phone call to discuss discharge planning. Pt's daughter reports she feels pt is not safe to discharge back to ILF due to needing higher LOC and supervision. Pt's daughter reports pt does not believe her condition is worsening. Pallative care consulted. PT/OT consulted. TOC will await recommendations.   Expected Discharge Plan: Assisted Living Barriers to Discharge: Continued Medical Work up   Patient Goals and CMS Choice Patient states their goals for this hospitalization and ongoing recovery are:: TBD        Expected Discharge Plan and Services In-house Referral: Clinical Social Work Discharge Planning Services: NA Post Acute Care Choice: Resumption of Svcs/PTA Provider Living arrangements for the past 2 months: Independent Living Facility                 DME Arranged: N/A DME Agency: NA       HH Arranged: NA HH Agency: NA        Prior Living Arrangements/Services Living arrangements for the past 2 months: Independent Living Facility Lives with:: Self Patient language and need for interpreter reviewed:: Yes Do you feel safe going back to the place where you live?: No   Pt's daughter does not think pt can live in ILF  Need for Family Participation in Patient Care: Yes (Comment) Care giver support system in place?: Yes (comment)   Criminal Activity/Legal Involvement Pertinent to Current Situation/Hospitalization: No - Comment as needed  Activities of Daily Living   ADL Screening (condition at time of  admission) Independently performs ADLs?: No Does the patient have a NEW difficulty with bathing/dressing/toileting/self-feeding that is expected to last >3 days?: Yes (Initiates electronic notice to provider for possible OT consult) Does the patient have a NEW difficulty with getting in/out of bed, walking, or climbing stairs that is expected to last >3 days?: Yes (Initiates electronic notice to provider for possible PT consult) Does the patient have a NEW difficulty with communication that is expected to last >3 days?: No Is the patient deaf or have difficulty hearing?: No Does the patient have difficulty seeing, even when wearing glasses/contacts?: No Does the patient have difficulty concentrating, remembering, or making decisions?: No  Permission Sought/Granted Permission sought to share information with : Facility Medical Sales Representative, Family Supports, Case Manager Permission granted to share information with : Yes, Verbal Permission Granted  Share Information with NAME: [name] Molt  Permission granted to share info w AGENCY: [facility], hospice, SNF  Permission granted to share info w Relationship: Daughter  Permission granted to share info w Contact Information: [phone]  Emotional Assessment Appearance:: Appears stated age Attitude/Demeanor/Rapport: Unable to Assess Affect (typically observed): Unable to Assess Orientation: : Oriented to Self, Oriented to Place, Oriented to  Time, Oriented to Situation Alcohol / Substance Use: Not Applicable Psych Involvement: No (comment)  Admission diagnosis:  Mesenteric mass [K63.89] Intraabdominal mass [R19.00] Constipation, unspecified constipation type [K59.00] Patient Active Problem List   Diagnosis Date Noted   Mesenteric mass 04/23/2024   Leukocytosis 04/23/2024   Normocytic anemia 04/23/2024  Weakness 04/23/2024   Constipation 04/23/2024   Gastroesophageal reflux disease with esophagitis without hemorrhage 03/27/2022    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   History of anemia 03/27/2022   Fatigue 03/27/2022   Atherosclerosis of aorta 12/31/2021   Hiatal hernia 12/31/2021   Hematemesis 12/22/2021   Hyperglycemia 12/22/2021   Mild protein malnutrition 12/22/2021   Back pain 12/03/2021   Venous stasis dermatitis of left lower extremity 10/31/2019   Generalized osteoarthritis of multiple sites 10/31/2019   Pure hypercholesterolemia 04/14/2016   Varicose veins of left leg with edema 12/20/2015   TIA (transient ischemic attack) 12/20/2015   H/O: hysterectomy 05/17/2013   Hernia, rectovaginal 05/17/2013   Acne erythematosa 11/25/2011   Dermatitis, eczematoid 01/20/2011   H/O transient cerebral ischemia 01/20/2011   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 - [address] AT CORNER OF [facility] [address] [place] [address] Phone: [phone] Fax: [phone]  [id number] - [address], [place] - [address] [place] [address] Phone: [phone] Fax: [phone]   Social Drivers of Health (SDOH) Social History: SDOH Screenings   Food Insecurity: No Food Insecurity (04/23/2024)  Housing: Low Risk  (04/23/2024)  Transportation Needs: No Transportation Needs (04/23/2024)  Utilities: Not At Risk (04/23/2024)  Alcohol Screen: Low Risk  (11/04/2023)  Depression (PHQ2-9): Low Risk  (11/04/2023)  Financial Resource Strain: Low Risk  (11/04/2023)  Physical Activity: Inactive (11/04/2023)  Social Connections: Socially Isolated (04/23/2024)  Stress: No Stress Concern Present (11/04/2023)  Tobacco Use: Low Risk  (04/23/2024)  Health Literacy: Adequate Health Literacy (11/04/2023)   SDOH Interventions: None     Readmission Risk Interventions     No data to display          Signed: Heather Saltness, MSW, LCSW Clinical Social Worker Inpatient Care Management 04/24/2024 2:10 PM

## 2024-04-24 NOTE — Progress Notes (Signed)
 PROGRESS NOTE Beth BERNINGER  FMW:[id number] DOB: 12-30-1924 DOA: 04/23/2024 PCP: Jordan, Betty G, MD  Brief Narrative/Hospital Course: Beth Cantu is a 89 y.o. female with PMH of  HLD, GERD, hx of TIA, hiatal hernia, varicose veins, venous stasis who presented to ED with lower abdominal pain and constipation x 1 wk, decreased appetite , increased fatigue with significant weakness  and lost a significant amount of weight (can not tell me number)  and can hardly make it across the room w/ N/V/D. In ED: vitals: afebrile, bp: 138/59, HR: 83, RR: 45-->16, oxygen: 98%RA: Pertinent labs: WBC: 15.8, hgb: 11.4, platelets 463,  CT abdomen/pelvis: Peripherally enhancing lobulated multiloculated right lower abdominal mesenteric mass abutting the terminal ileum and sigmoid, with suspected terminal ileal wall thickening. Findings concerning for malignancy. Three hypodense peripherally enhancing hepatic masses suggestive of metastases.  Large volume hiatal hernia containing entire stomach and long loop of transverse colon-no obstruction/ischemia finding, nephrolithiasis colonic diverticulosis Oncology, IR consulted and admitted for further management  Subjective: Seen and examined today Daughter palliative care at the bedside Alert awake, abdominal discomfort Overnight on room air afebrile, VSS, Labs reviewed blood sugar 220s otherwise stable renal function CBC with leukocytosis thrombocytosis iron saturation low at 6 serum iron 14   Assessment and plan:  Mesenteric mass-aspect stage IV cancer with mets to liver Abdominal pain Constipation Failure to thrive Generalized weakness: CT with mesenteric mass abutting terminal ileum, with ileal wall thickening and finding concerning for metastasis in the liver Overall poor prognosis, advanced age and having failure to thrive.  Her symptoms has been rapidly progressing in the past 8 days suspect overall poor prognosis with stage IV cancer, along with hiatal hernia  containing entire stomach CEA CA125 pending I patient wants to proceed for evaluation by oncology and see what her options are  palliative care currently she is DNR.  Overall prognosis does not appear bright  Iron deficiency anemia: Iron supplementation on discharge.  Transfuse for less than 7.  TSH B12 okay  Leukocytosis: Likely due to malignancy.  Hiatal hernia containing entire stomach and long loop of transverse colon GERD: This limits her her oral intake, keeping PPI unfortunately not much options it seems  Severe malnutrition: In the setting of suspected malignancy with large hiatal hernia advanced age.  Overall poor prognosis augment diet  Mobility: PT Orders: Active  PT Follow up Rec:    DVT prophylaxis: SCDs Start: 04/23/24 2131 Code Status:   Code Status: Limited: Do not attempt resuscitation (DNR) -DNR-LIMITED -Do Not Intubate/DNI  Family Communication: plan of care discussed with patient/daughter at bedside. Patient status is: Remains hospitalized because of severity of illness Level of care: Med-Surg   Dispo: The patient is from: ilf            Anticipated disposition: TBD Objective: Vitals last 24 hrs: Vitals:   04/23/24 1808 04/23/24 2320 04/24/24 0320 04/24/24 0723  BP: 130/62 (!) 135/59 (!) 154/68 (!) 153/67  Pulse: 83 94 75 80  Resp: 16 17 18 15   Temp: 97.6 F (36.4 C) 97.9 F (36.6 C) 97.8 F (36.6 C) 97.7 F (36.5 C)  TempSrc: Oral Oral Oral Oral  SpO2: 95% 95% 94% 93%  Weight:  55.1 kg    Height:  5' 3 (1.6 m)      Physical Examination: General exam: alert awake, oriented, older than stated age HEENT:Oral mucosa moist, Ear/Nose WNL grossly Respiratory system: Bilaterally clear BS,no use of accessory muscle Cardiovascular system: S1 & S2 +, No  JVD. Gastrointestinal system: Abdomen soft,NT,ND, BS+ Nervous System: Alert, awake, moving all extremities,and following commands. Extremities: extremities warm, leg edema neg Skin: Warm, no  rashes MSK: Normal muscle bulk,tone, power   Medications reviewed:  Scheduled Meds:  docusate sodium  100 mg Oral Daily   feeding supplement  237 mL Oral BID BM   multivitamin with minerals  1 tablet Oral Daily   pantoprazole   40 mg Oral Daily   psyllium  1 packet Oral Daily   sodium phosphate  1 enema Rectal Once   Continuous Infusions:  sodium chloride  75 mL/hr at 04/23/24 2239   Diet: Diet Order             Diet regular Room service appropriate? Yes; Fluid consistency: Thin  Diet effective now                    Data Reviewed: I have personally reviewed following labs and imaging studies ( see epic result tab) CBC: Recent Labs  Lab 04/23/24 1637 04/24/24 0100  WBC 15.8* 15.9*  HGB 11.4* 10.2*  HCT 35.3* 31.3*  MCV 88.5 87.7  PLT 463* 428*   CMP: Recent Labs  Lab 04/23/24 1637 04/23/24 2246 04/24/24 0100  NA 145  --  136  K 4.6  --  4.1  CL 104  --  102  CO2 29  --  25  GLUCOSE 144*  --  220*  BUN 28*  --  26*  CREATININE 0.51  --  0.47  CALCIUM 9.5  --  8.7*  MG  --  1.9  --    GFR: Estimated Creatinine Clearance: 33.3 mL/min (by C-G formula based on SCr of 0.47 mg/dL). Recent Labs  Lab 04/23/24 1637  AST 18  ALT 11  ALKPHOS 77  BILITOT 0.8  PROT 6.3*  ALBUMIN 3.2*    Recent Labs  Lab 04/23/24 1637  LIPASE 14   No results for input(s): AMMONIA in the last 168 hours. Coagulation Profile: No results for input(s): INR, PROTIME in the last 168 hours. Unresulted Labs (From admission, onward)     Start     Ordered   04/23/24 2300  CA 125  Once,   R        04/23/24 2300   04/23/24 2300  CEA  Once,   R        04/23/24 2300   04/23/24 1627  Urinalysis, Routine w reflex microscopic -Urine, Clean Catch  Once,   URGENT       Question:  Specimen Source  Answer:  Urine, Clean Catch   04/23/24 1627           Antimicrobials/Microbiology: Anti-infectives (From admission, onward)    None      No results found for: SDES,  SPECREQUEST, CULT, REPTSTATUS  Procedures:    Mennie LAMY, MD [facility] 04/24/2024, 11:50 AM

## 2024-04-25 DIAGNOSIS — K6389 Other specified diseases of intestine: Secondary | ICD-10-CM | POA: Diagnosis present

## 2024-04-25 DIAGNOSIS — R627 Adult failure to thrive: Secondary | ICD-10-CM | POA: Diagnosis present

## 2024-04-25 DIAGNOSIS — K2100 Gastro-esophageal reflux disease with esophagitis, without bleeding: Secondary | ICD-10-CM | POA: Diagnosis present

## 2024-04-25 DIAGNOSIS — C787 Secondary malignant neoplasm of liver and intrahepatic bile duct: Secondary | ICD-10-CM | POA: Diagnosis present

## 2024-04-25 DIAGNOSIS — I10 Essential (primary) hypertension: Secondary | ICD-10-CM | POA: Diagnosis present

## 2024-04-25 DIAGNOSIS — Z886 Allergy status to analgesic agent status: Secondary | ICD-10-CM | POA: Diagnosis not present

## 2024-04-25 DIAGNOSIS — E785 Hyperlipidemia, unspecified: Secondary | ICD-10-CM | POA: Diagnosis present

## 2024-04-25 DIAGNOSIS — E43 Unspecified severe protein-calorie malnutrition: Secondary | ICD-10-CM | POA: Diagnosis present

## 2024-04-25 DIAGNOSIS — Z6821 Body mass index (BMI) 21.0-21.9, adult: Secondary | ICD-10-CM | POA: Diagnosis not present

## 2024-04-25 DIAGNOSIS — D75839 Thrombocytosis, unspecified: Secondary | ICD-10-CM | POA: Diagnosis present

## 2024-04-25 DIAGNOSIS — Z66 Do not resuscitate: Secondary | ICD-10-CM | POA: Diagnosis present

## 2024-04-25 DIAGNOSIS — I878 Other specified disorders of veins: Secondary | ICD-10-CM | POA: Diagnosis present

## 2024-04-25 DIAGNOSIS — K5900 Constipation, unspecified: Secondary | ICD-10-CM | POA: Diagnosis present

## 2024-04-25 DIAGNOSIS — Z881 Allergy status to other antibiotic agents status: Secondary | ICD-10-CM | POA: Diagnosis not present

## 2024-04-25 DIAGNOSIS — C481 Malignant neoplasm of specified parts of peritoneum: Secondary | ICD-10-CM | POA: Diagnosis present

## 2024-04-25 DIAGNOSIS — D72829 Elevated white blood cell count, unspecified: Secondary | ICD-10-CM | POA: Diagnosis present

## 2024-04-25 DIAGNOSIS — R1907 Generalized intra-abdominal and pelvic swelling, mass and lump: Secondary | ICD-10-CM | POA: Diagnosis present

## 2024-04-25 DIAGNOSIS — Z515 Encounter for palliative care: Secondary | ICD-10-CM | POA: Diagnosis not present

## 2024-04-25 DIAGNOSIS — Z8673 Personal history of transient ischemic attack (TIA), and cerebral infarction without residual deficits: Secondary | ICD-10-CM | POA: Diagnosis not present

## 2024-04-25 DIAGNOSIS — K449 Diaphragmatic hernia without obstruction or gangrene: Secondary | ICD-10-CM | POA: Diagnosis present

## 2024-04-25 DIAGNOSIS — D509 Iron deficiency anemia, unspecified: Secondary | ICD-10-CM | POA: Diagnosis present

## 2024-04-25 DIAGNOSIS — G8929 Other chronic pain: Secondary | ICD-10-CM | POA: Diagnosis present

## 2024-04-25 DIAGNOSIS — R54 Age-related physical debility: Secondary | ICD-10-CM | POA: Diagnosis present

## 2024-04-25 LAB — BASIC METABOLIC PANEL WITH GFR
Anion gap: 8 (ref 5–15)
BUN: 24 mg/dL — ABNORMAL HIGH (ref 8–23)
CO2: 25 mmol/L (ref 22–32)
Calcium: 8.9 mg/dL (ref 8.9–10.3)
Chloride: 102 mmol/L (ref 98–111)
Creatinine, Ser: 0.48 mg/dL (ref 0.44–1.00)
GFR, Estimated: >60 mL/min (ref >60)
Glucose, Bld: 216 mg/dL — ABNORMAL HIGH (ref 70–99)
Potassium: 4.2 mmol/L (ref 3.5–5.1)
Sodium: 135 mmol/L (ref 135–145)

## 2024-04-25 LAB — CBC
HCT: 31.9 % — ABNORMAL LOW (ref 36.0–46.0)
Hemoglobin: 10.4 g/dL — ABNORMAL LOW (ref 12.0–15.0)
MCH: 28.7 pg (ref 26.0–34.0)
MCHC: 32.6 g/dL (ref 30.0–36.0)
MCV: 88.1 fL (ref 80.0–100.0)
Platelets: 465 K/uL — ABNORMAL HIGH (ref 150–400)
RBC: 3.62 MIL/uL — ABNORMAL LOW (ref 3.87–5.11)
RDW: 13.2 % (ref 11.5–15.5)
WBC: 20.5 K/uL — ABNORMAL HIGH (ref 4.0–10.5)
nRBC: 0 % (ref 0.0–0.2)

## 2024-04-25 LAB — CA 125: Cancer Antigen (CA) 125: 54.6 U/mL — ABNORMAL HIGH (ref 0.0–38.1)

## 2024-04-25 LAB — CEA: CEA: 312 ng/mL — ABNORMAL HIGH (ref 0.0–4.7)

## 2024-04-25 MED ORDER — MORPHINE SULFATE (PF) 2 MG/ML IV SOLN
1.0000 mg | INTRAVENOUS | Status: DC | PRN
Start: 2024-04-25 — End: 2024-04-27
  Administered 2024-04-25 – 2024-04-27 (×4): 1 mg via INTRAVENOUS
  Filled 2024-04-25 (×4): qty 1

## 2024-04-25 MED ORDER — SODIUM CHLORIDE 0.9 % IV SOLN: INTRAVENOUS | Status: AC

## 2024-04-25 NOTE — Progress Notes (Signed)
 Palliative Medicine Inpatient Follow Up Note HPI: Beth Cantu is a 89 y.o. female with medical history significant of HLD, GERD, hx of TIA, hiatal hernia varicose veins, venous stasis who presented to ED with lower abdominal pain and constipation over the past week. Also has had decreased appetite and increased fatigue with significant weakness. Palliative care asked to support goals of care conversations in the setting of an intraabdominal mass.   Today's Discussion 04/25/2024  *Please note that this is a verbal dictation therefore any spelling or grammatical errors are due to the Dragon Medical One system interpretation.  Chart reviewed inclusive of vital signs, progress notes, laboratory results, and diagnostic images.   I met with Beth Cantu this afternoon. We discussed the conversations which had been shared between she and the oncologist. We reviewed her current clinical state, frailty, adult FTT, and poor likelihood of candidacy for chemotherapy as this would likely lead to a more rapid demise. She states she would not want chemotherapy as it stands and understands that she would not do well. Created space and opportunity for patient to explore thoughts feelings and fears regarding current medical situation. 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   We reviewed the limited options which can be offered at this juncture for Beth Cantu, she defers to her daughter, Beth Cantu. I shared it's important that Beth Cantu be aware of what is going on and determine what she wants moving forward. She shares knowledge that without treatment or intervention her time will be quite limited on earth. We gently discussed if that is the case where we would go from this point forward which would likely be allowing her to remain symptoms free and at peace from the perspective of discomfort. She shares this is what she would want. We reviewed the plan to meet with her grandson tomorrow.  I met with patients  daughter, Beth Cantu outside the room. We discussed the understanding that Beth Cantu's life is limited to weeks. Beth Cantu is hopeful for placement in a hospice home and understands if she is not accepted at a hospice home the need to go a skilled facility with hospice. She has elected to proceed with [facility] at this time for evaluation of Beth Cantu in addition to learning more about services. She requests that we do not use the word hospice as it causes Beth Cantu a degree of distress. I shared that this is understood.  Questions and concerns addressed/Palliative Support Provided.   Objective Assessment: Vital Signs Vitals:   04/25/24 0633 04/25/24 1303  BP: (!) 150/77 (!) 144/67  Pulse: 100 95  Resp: 16 16  Temp: (!) 97.5 F (36.4 C) 97.8 F (36.6 C)  SpO2: 93% 97%    Intake/Output Summary (Last 24 hours) at 04/25/2024 1312 Last data filed at 04/25/2024 0900 Gross per 24 hour  Intake 537 ml  Output --  Net 537 ml   Last Weight  Most recent update: 04/23/2024 11:22 PM    Weight  55.1 kg (121 lb 7.6 oz)            Gen: Frail elderly Caucasian female HEENT: moist mucous membranes CV: Regular rate and rhythm  PULM: On room air breathing is even and unlabored ABD: Abdominal tenderness EXT: Thin extremities Neuro: Alert and oriented x3   SUMMARY OF RECOMMENDATIONS   DNAR/DNI  Patient shares not wanting chemotherapy - she understands that options are limited and her time on earth as well is narrow  Patients daughter  would like Beth Cantu evaluated for the hospice home in [facility] for family meeting tomorrow at 3PM  The PMT will remain involved and supportive of Beth Cantu and her family during this difficult time ______________________________________________________________________________________ Beth Cantu [facility] Palliative Medicine Team Team Cell Phone: [phone] Please utilize secure chat with additional questions, if there is no response within 30 minutes please  call the above phone number  Time Spent: 70 Billing based on MDM: High   Palliative Medicine Team providers are available by phone from 7am to 7pm daily and can be reached through the team cell phone.  Should this patient require assistance outside of these hours, please call the patient's attending physician.

## 2024-04-25 NOTE — TOC Progression Note (Signed)
 Transition of Care [facility]) - Progression Note    Patient Details  Name: Beth Cantu MRN: [id number] Date of Birth: January 10, 1925  Transition [facility]) CM/SW Contact  Heather DELENA Saltness, LCSW Phone Number: 04/25/2024, 12:31 PM  Clinical Narrative:    Per palliative care team, pt and pt's daughter are wanting to pursue residential hospice services. Per pt/daughter's choice, referral made to [facility]. Hospital liaison, Magdalena Berber, to complete schedule initial assessment. Palliative care meeting scheduled at 3 PM tomorrow to discuss hospice with daughter and grandsons. TOC will continue to follow.   Expected Discharge Plan: Assisted Living Barriers to Discharge: Continued Medical Work up   Expected Discharge Plan and Services In-house Referral: Clinical Social Work Discharge Planning Services: NA Post Acute Care Choice: Resumption of Svcs/PTA Provider Living arrangements for the past 2 months: Independent Living Facility                 DME Arranged: N/A DME Agency: NA       HH Arranged: NA HH Agency: NA         Social Drivers of Health (SDOH) Interventions SDOH Screenings   Food Insecurity: No Food Insecurity (04/23/2024)  Housing: Low Risk  (04/23/2024)  Transportation Needs: No Transportation Needs (04/23/2024)  Utilities: Not At Risk (04/23/2024)  Alcohol Screen: Low Risk  (11/04/2023)  Depression (PHQ2-9): Low Risk  (11/04/2023)  Financial Resource Strain: Low Risk  (11/04/2023)  Physical Activity: Inactive (11/04/2023)  Social Connections: Socially Isolated (04/23/2024)  Stress: No Stress Concern Present (11/04/2023)  Tobacco Use: Low Risk  (04/23/2024)  Health Literacy: Adequate Health Literacy (11/04/2023)    Readmission Risk Interventions     No data to display          Signed: Heather Saltness, MSW, LCSW Clinical Social Worker Inpatient Care Management 04/25/2024 12:35 PM

## 2024-04-25 NOTE — Assessment & Plan Note (Signed)
 See mesenteric mass

## 2024-04-25 NOTE — Assessment & Plan Note (Signed)
 Beth Cantu

## 2024-04-25 NOTE — Progress Notes (Signed)
 OT Cancellation Note  Patient Details Name: Beth Cantu MRN: [id number] DOB: 1925/03/29   Cancelled Treatment:    Reason Eval/Treat Not Completed: (P) Fatigue/lethargy limiting ability to participate, has been tired today, working with palliative on a plan, limited participation with PT earlier, will return tomorrow.   [name] 04/25/2024, 2:42 PM

## 2024-04-25 NOTE — Plan of Care (Signed)
  Problem: Health Behavior/Discharge Planning: Goal: Ability to manage health-related needs will improve Outcome: Progressing   Problem: Pain Managment: Goal: General experience of comfort will improve and/or be controlled Outcome: Progressing   Problem: Skin Integrity: Goal: Risk for impaired skin integrity will decrease Outcome: Progressing

## 2024-04-25 NOTE — Evaluation (Signed)
 Physical Therapy Evaluation Patient Details Name: Beth Cantu MRN: [id number] DOB: June 01, 1925 Today's Date: 04/25/2024  History of Present Illness  Beth Cantu is a 89 y.o. female who presented to ED with lower abdominal pain and constipation. CT abdomen/pelvis: Peripherally enhancing lobulated multiloculated right lower abdominal mesenteric mass abutting the terminal ileum and sigmoid, with suspected terminal ileal wall thickening. Findings concerning for malignancy. Three hypodense peripherally enhancing hepatic masses suggestive of metastases.  Large volume hiatal hernia containing entire stomach and long loop of transverse colon-no obstruction/ischemia finding, nephrolithiasis colonic diverticulosis. PMH: HLD, GERD, hx of TIA, hiatal hernia varicose veins, venous stasis  Clinical Impression  Pt admitted with above diagnosis. PTA, pt reports ind with rollator in [place] apartment, has meals delivered to her, ind with bathing, dressing and toileting, daughter recently started doing pt's laundry, facility provides housecleaning. On eval, pt needing much encouragement with participation, reports fatigued and wanting to nap. Pt mobilizes BLE towards edge of bed but then requests to return to bed, reporting fatigue. Pt needing min A to roll R/L to reposition pillows for comfort, cued for bedrail use to assist as needed. Pt pulls on therapist's hands to upright trunk as daughter assists with shirt repositioning. Pt pleasant, able to follow commands, a&o, though does appear to lack insight to deficits as her goal is to return to [facility] independently. Patient will benefit from continued inpatient follow up therapy, <3 hours/day. Noted palliative consult in chart; daughter present during eval offering encouragement to pt. Pt currently with functional limitations due to the deficits listed below (see PT Problem List). Pt will benefit from acute skilled PT to increase their independence and safety with  mobility to allow discharge.           If plan is discharge home, recommend the following: A little help with walking and/or transfers;A little help with bathing/dressing/bathroom;Assistance with cooking/housework;Assist for transportation;Help with stairs or ramp for entrance   Can travel by private vehicle   No    Equipment Recommendations None recommended by PT (defer to next venue)  Recommendations for Other Services       Functional Status Assessment Patient has had a recent decline in their functional status and demonstrates the ability to make significant improvements in function in a reasonable and predictable amount of time.     Precautions / Restrictions Precautions Precautions: Fall Restrictions Weight Bearing Restrictions Per Provider Order: No      Mobility  Bed Mobility Overal bed mobility: Needs Assistance Bed Mobility: Rolling Rolling: Min assist, Used rails         General bed mobility comments: min A to roll R/L for therapist to reposition pillow for comfort; cued pt into hooklying and able to push through BLE to assist in scooting up in bed with therapist assisting with bedpad    Transfers                   General transfer comment: pt declines    Ambulation/Gait                  Stairs            Wheelchair Mobility     Tilt Bed    Modified Rankin (Stroke Patients Only)       Balance  Pertinent Vitals/Pain Pain Assessment Pain Assessment: Faces Faces Pain Scale: Hurts even more Pain Location: back Pain Descriptors / Indicators: Discomfort Pain Intervention(s): Limited activity within patient's tolerance, Monitored during session, Repositioned    Home Living Family/patient expects to be discharged to:: Assisted living ([facility])                 Home Equipment: Rollator (4 wheels);Other (comment) (adujustable bed)      Prior  Function Prior Level of Function : Independent/Modified Independent;Needs assist             Mobility Comments: [place] at baseline, walks laps in kitchen for exercise, admits to 1 fall, had OPPT this year for back pain but unsuccessful ADLs Comments: pt reports ind with bathing, dressing, and toileting; [facility] delivers meals to [place] apartment; daughter recently started doing pt's laundry     Extremity/Trunk Assessment   Upper Extremity Assessment Upper Extremity Assessment: Defer to OT evaluation    Lower Extremity Assessment Lower Extremity Assessment: Generalized weakness;RLE deficits/detail;LLE deficits/detail RLE Deficits / Details: ankle AROM WFL, full knee extension in supine, able to flex knees to ~100 deg while supine, hip abd/add ~6 inches bilatearlly, denies numbness/tingling throughout RLE Sensation: WNL RLE Coordination: WNL LLE Deficits / Details: ankle AROM WFL, full knee extension in supine, able to flex knees to ~100 deg while supine, hip abd/add ~6 inches bilatearlly, denies numbness/tingling throughout LLE Sensation: WNL LLE Coordination: WNL       Communication   Communication Communication: No apparent difficulties    Cognition Arousal: Alert Behavior During Therapy: WFL for tasks assessed/performed   PT - Cognitive impairments: No apparent impairments                       PT - Cognition Comments: pt a&o, follows commands, family at bedside report pt appears at baseline cognitively Following commands: Intact       Cueing       General Comments      Exercises     Assessment/Plan    PT Assessment Patient needs continued PT services  PT Problem List Decreased strength;Decreased activity tolerance;Decreased balance;Pain       PT Treatment Interventions DME instruction;Gait training;Functional mobility training;Therapeutic activities;Therapeutic exercise;Balance training;Neuromuscular  re-education;Patient/family education    PT Goals (Current goals can be found in the Care Plan section)  Acute Rehab PT Goals Patient Stated Goal: return to [facility] independently PT Goal Formulation: With patient/family Time For Goal Achievement: 05/09/24 Potential to Achieve Goals: Fair    Frequency Min 2X/week     Co-evaluation               AM-PAC PT 6 Clicks Mobility  Outcome Measure Help needed turning from your back to your side while in a flat bed without using bedrails?: A Little Help needed moving from lying on your back to sitting on the side of a flat bed without using bedrails?: A Little Help needed moving to and from a bed to a chair (including a wheelchair)?: A Lot Help needed standing up from a chair using your arms (e.g., wheelchair or bedside chair)?: A Lot Help needed to walk in hospital room?: Total Help needed climbing 3-5 steps with a railing? : Total 6 Click Score: 12    End of Session   Activity Tolerance: Patient tolerated treatment well Patient left: in bed;with call bell/phone within reach;with bed alarm set;with family/visitor present Nurse Communication: Mobility status PT Visit Diagnosis: Muscle  weakness (generalized) (M62.81);Difficulty in walking, not elsewhere classified (R26.2)    Time: 8895-8874 PT Time Calculation (min) (ACUTE ONLY): 21 min   Charges:   PT Evaluation $PT Eval Moderate Complexity: 1 Mod   PT General Charges $$ ACUTE PT VISIT: 1 Visit         Tori Starsky Nanna PT, DPT 04/25/24, 11:41 AM

## 2024-04-25 NOTE — Progress Notes (Signed)
 Initial Nutrition Assessment  INTERVENTION:   -Ensure Plus High Protein po BID, each supplement provides 350 kcal and 20 grams of protein.   NUTRITION DIAGNOSIS:   Inadequate oral intake related to constipation, decreased appetite as evidenced by per patient/family report.  GOAL:   Patient will meet greater than or equal to 90% of their needs  MONITOR:   PO intake, Supplement acceptance  REASON FOR ASSESSMENT:   Consult Assessment of nutrition requirement/status  ASSESSMENT:   89 y.o. female with PMH of  HLD, GERD, hx of TIA, hiatal hernia, varicose veins, venous stasis who presented to ED with lower abdominal pain and constipation x 1 wk, decreased appetite , increased fatigue with significant weakness  and lost a significant amount of weight (can not tell me number)  and can hardly make it across the room w/ N/V/D.  Patient followed by palliative care, per note today, pt opting to discharge to residential hospice. Official family meeting tomorrow.  Pt currently consuming 0-75% of meals. Drinking Ensure supplements.  Pt was having abdominal pain, constipation for a week PTA. Now with new metastatic disease. Oncology stating pt not a candidate for chemo.  Recommend continue current interventions and PO as tolerated.  Per weight records, pt weighed 125 lbs on 7/30. Current weight: 121 lbs.  Medications: Colace, Multivitamin with minerals daily, Metamucil  Labs reviewed: Low iron   NUTRITION - FOCUSED PHYSICAL EXAM: Deferred  Diet Order:   Diet Order             Diet regular Room service appropriate? No; Fluid consistency: Thin  Diet effective now                   EDUCATION NEEDS:   Not appropriate for education at this time  Skin:  Skin Assessment: Reviewed RN Assessment  Last BM:  11/18 -type 6  Height:   Ht Readings from Last 1 Encounters:  04/23/24 5' 3 (1.6 m)    Weight:   Wt Readings from Last 1 Encounters:  04/23/24 55.1 kg    BMI:   Body mass index is 21.52 kg/m.  Estimated Nutritional Needs:   Kcal:  1300-1500  Protein:  65-75g  Fluid:  1.5L/day   Morna Lee, MS, RD, LDN Inpatient Clinical Dietitian Contact via Secure chat

## 2024-04-25 NOTE — Progress Notes (Signed)
 Progress Note   Patient: Beth Cantu FMW:[id number] DOB: 1924/12/20 DOA: 04/23/2024     0 DOS: the patient was seen and examined on 04/25/2024   Brief hospital course: Beth Cantu is a 89 y.o. female with PMH of  HLD, GERD, hx of TIA, hiatal hernia, varicose veins, venous stasis who presented to ED with lower abdominal pain and constipation x 1 wk, decreased appetite, increased fatigue with significant weakness and lost a significant amount of weight and can hardly make it across the room. Also with N/V/D and poor PO intake.  In the ED: vitals: afebrile, bp: 138/59, HR: 83, RR: 45-->16, oxygen: 98%RA: Pertinent labs: WBC: 15.8, hgb: 11.4, platelets 463   CT abdomen/pelvis: Peripherally enhancing lobulated multiloculated right lower abdominal mesenteric mass abutting the terminal ileum and sigmoid, with suspected terminal ileal wall thickening. Findings concerning for malignancy. Three hypodense peripherally enhancing hepatic masses suggestive of metastases.  Large volume hiatal hernia containing entire stomach and long loop of transverse colon-no obstruction/ischemia finding, nephrolithiasis colonic diverticulosis  Oncology, Palliative Care, IR consulted and admitted to the hospital for further evaluation and management as outlined in detail below.  11/18 -- seen by Oncology and Palliative Care.  Plan is to pursue Hospice care give poor likelihood patient would be a candidate for aggressive treatments like chemotherapy.  Therefore biopsy is not being pursued.   Patient is being evaluation for hospice home in [facility].      Assessment and Plan: * Mesenteric mass 89 year old female presenting to ED with one week history of lower abdominal pain and constipation with poor PO intake and increasing weakness found to have loculated multiloculated right lower abdominal mesenteric mass abutting the terminal ileum and sigmoid with suspected terminal ileal wall thickening. Liver lesions also present  concerning for metastatic disease. --Oncology & Palliative care consulted --Plan is for Hospice on discharge  --TOC following --Goal is for pain & symptom control --DNR/DNI   Weakness She has had decreased PO intake with weight loss and now significant weakness, likely due to malignancy --Fall precautions --PT/OT consulted on admission  Leukocytosis No signs/symptoms of infection, afebrile. Likely inflammatory vs. hemoconcentrated in setting of malignancy and poor PO intake --Will continue gentle gentle IVF --Monitor CBC & clinically for s/sx's of infection  Normocytic anemia Iron deficiency B12 normal --CBC to monitor  Hiatal hernia Large volume hiatal hernia containing the entire stomach and long loop of transverse colon.  No evidence of obstruction  --Continue PPI   Constipation Bowel regimen per orders  Gastroesophageal reflux disease with esophagitis without hemorrhage Continue PPI   Mild protein malnutrition Continue ensure  Nutrition consult   Intraabdominal mass See mesenteric mass  Multiple metastatic lesions in liver (HCC) .        Subjective: Pt seen with daughter at bedside this AM.  Pt reports legs being very weak since Thursday, she's been too weak to even roll over in the bed.  Daughter reports minimal PO intake, only drinking about one glass of water and 1-2 protein drinks per day.  Pt often in pain but does not report it because she dislikes taking medications.  Pt denies other acute complaints.    Physical Exam: Vitals:   04/24/24 1154 04/24/24 2157 04/25/24 0633 04/25/24 1303  BP: (!) 154/57 (!) 152/71 (!) 150/77 (!) 144/67  Pulse: 84 91 100 95  Resp: 17 14 16 16   Temp: 97.7 F (36.5 C) 97.6 F (36.4 C) (!) 97.5 F (36.4 C) 97.8 F (36.6  C)  TempSrc: Oral Oral Oral Oral  SpO2: 96% 93% 93% 97%  Weight:      Height:       General exam: awake, alert, no acute distress HEENT: moist mucus membranes, hearing grossly normal   Respiratory system: CTAB, no wheezes, rales or rhonchi, normal respiratory effort. Cardiovascular system: normal S1/S2, RRR, no JVD, murmurs, rubs, gallops,  no pedal edema.   Gastrointestinal system: soft, palpable large lower abdominal mass Central nervous system: A&O x 3. no gross focal neurologic deficits, normal speech Extremities: moves all, no edema, normal tone Skin: dry, intact, normal temperature Psychiatry: normal mood, congruent affect, judgement and insight appear normal    Data Reviewed:  Notable labs -- glucose 216, BUN 24, WBC 20.5, Hbg 10.4, platelets 465k CEA 312.0 elevated CA-125 54.6 elevated   Family Communication: daughter at bedside  Disposition: Status is: Observation    Planned Discharge Destination: Hospice    Time spent: 45 minutes  Author: Burnard DELENA Cunning, DO 04/25/2024 1:52 PM  For on call review [email or web address].

## 2024-04-26 DIAGNOSIS — K6389 Other specified diseases of intestine: Secondary | ICD-10-CM | POA: Diagnosis not present

## 2024-04-26 LAB — BASIC METABOLIC PANEL WITH GFR
Anion gap: 11 (ref 5–15)
BUN: 28 mg/dL — ABNORMAL HIGH (ref 8–23)
CO2: 23 mmol/L (ref 22–32)
Calcium: 8.9 mg/dL (ref 8.9–10.3)
Chloride: 102 mmol/L (ref 98–111)
Creatinine, Ser: 0.48 mg/dL (ref 0.44–1.00)
GFR, Estimated: >60 mL/min (ref >60)
Glucose, Bld: 124 mg/dL — ABNORMAL HIGH (ref 70–99)
Potassium: 4.5 mmol/L (ref 3.5–5.1)
Sodium: 136 mmol/L (ref 135–145)

## 2024-04-26 LAB — CBC
HCT: 32.4 % — ABNORMAL LOW (ref 36.0–46.0)
Hemoglobin: 10.5 g/dL — ABNORMAL LOW (ref 12.0–15.0)
MCH: 28.9 pg (ref 26.0–34.0)
MCHC: 32.4 g/dL (ref 30.0–36.0)
MCV: 89.3 fL (ref 80.0–100.0)
Platelets: 489 K/uL — ABNORMAL HIGH (ref 150–400)
RBC: 3.63 MIL/uL — ABNORMAL LOW (ref 3.87–5.11)
RDW: 13.2 % (ref 11.5–15.5)
WBC: 18.9 K/uL — ABNORMAL HIGH (ref 4.0–10.5)
nRBC: 0 % (ref 0.0–0.2)

## 2024-04-26 NOTE — Progress Notes (Signed)
   Palliative Medicine Inpatient Follow Up Note HPI: Beth Cantu is a 89 y.o. female with medical history significant of HLD, GERD, hx of TIA, hiatal hernia varicose veins, venous stasis who presented to ED with lower abdominal pain and constipation over the past week. Also has had decreased appetite and increased fatigue with significant weakness. Palliative care asked to support goals of care conversations in the setting of an intraabdominal mass.   Today's Discussion 04/26/2024  *Please note that this is a verbal dictation therefore any spelling or grammatical errors are due to the Dragon [employer] One system interpretation.  Chart reviewed inclusive of vital signs, progress notes, laboratory results, and diagnostic images.   I met this afternoon with Beth Cantu she is arousable and in good spirits.   We reviewed patients current health state in the setting of her disease burden she is in agreement with transitioning somewhere that can continue to control her symptoms. Allowing her to remain at peace.  She is aware of her current clinical state and possible outcomes.  Patients daughter, Beth Cantu shares that we not longer need to meet this afternoon at [facility]. She states that she is going to head to [facility] around 4-4:30 today. She feels additional questions will be geared towards them.   Questions and concerns addressed/Palliative Support Provided.   Objective Assessment: Vital Signs Vitals:   04/26/24 0428 04/26/24 1359  BP: (!) 142/73 122/66  Pulse: 89 94  Resp: 16 17  Temp: 98.1 F (36.7 C) 97.8 F (36.6 C)  SpO2: 94% 93%    Intake/Output Summary (Last 24 hours) at 04/26/2024 1406 Last data filed at 04/25/2024 1900 Gross per 24 hour  Intake 270.63 ml  Output --  Net 270.63 ml   Last Weight  Most recent update: 04/23/2024 11:22 PM    Weight  55.1 kg (121 lb 7.6 oz)            Gen: Frail elderly Caucasian female HEENT: moist mucous membranes CV: Regular rate  and rhythm  PULM: On room air breathing is even and unlabored ABD: Abdominal tenderness EXT: Thin extremities Neuro: Alert and oriented x3   SUMMARY OF RECOMMENDATIONS   DNAR/DNI  Plan for Rexine to transition to [facility]  The PMT will remain involved and supportive of Beth Cantu and her family during this difficult time ______________________________________________________________________________________ Rosaline Becton [place] Palliative Medicine Team Team Cell Phone: [phone] Please utilize secure chat with additional questions, if there is no response within 30 minutes please call the above phone number  Time Spent:33  Palliative Medicine Team providers are available by phone from 7am to 7pm daily and can be reached through the team cell phone.  Should this patient require assistance outside of these hours, please call the patient's attending physician.

## 2024-04-26 NOTE — Plan of Care (Signed)
  Problem: Education: Goal: Knowledge of General Education information will improve Description: Including pain rating scale, medication(s)/side effects and non-pharmacologic comfort measures Outcome: Progressing   Problem: Health Behavior/Discharge Planning: Goal: Ability to manage health-related needs will improve Outcome: Progressing   Problem: Clinical Measurements: Goal: Ability to maintain clinical measurements within normal limits will improve Outcome: Progressing Goal: Will remain free from infection Outcome: Progressing Goal: Diagnostic test results will improve Outcome: Progressing   Problem: Activity: Goal: Risk for activity intolerance will decrease Outcome: Progressing   Problem: Nutrition: Goal: Adequate nutrition will be maintained Outcome: Progressing   Problem: Coping: Goal: Level of anxiety will decrease Outcome: Progressing   Problem: Safety: Goal: Ability to remain free from injury will improve Outcome: Progressing   Problem: Pain Managment: Goal: General experience of comfort will improve and/or be controlled Outcome: Progressing   Problem: Skin Integrity: Goal: Risk for impaired skin integrity will decrease Outcome: Progressing

## 2024-04-26 NOTE — Progress Notes (Signed)
 PROGRESS NOTE    Beth Cantu  FMW:[id number] DOB: 17-Oct-1924 DOA: 04/23/2024 PCP: Jordan, Betty G, MD    Brief Narrative:   89 year old with history of HLD, GERD, TIA, hiatal hernia, varicose veins, venous stasis comes to the ED with lower abdominal pain and constipation.  Workup revealed large mesenteric mass abutting terminal ileum and sigmoid colon with liver lesions.  Oncology, palliative care and IR were consulted.  After discussions with the family, patient has been transition to hospice care.  At this time no real advance treatments are really an option for her given her advanced age and frailty.  Assessment & Plan:   Mesenteric mass with metastatic liver lesion Given her advanced age, frailty she is not really a candidate for any advanced therapies.  No biopsies have been planned.  Family interested in pursuing hospice which I agree.   Weakness Supportive care   Leukocytosis Reactive, will hold off on any further lab work   Normocytic anemia Iron deficiency B12 normal --CBC to monitor   Hiatal hernia Large volume hiatal hernia containing the entire stomach and long loop of transverse colon.  No evidence of obstruction  --Continue PPI    Constipation Bowel regimen per orders   Gastroesophageal reflux disease with esophagitis without hemorrhage Continue PPI    Mild protein malnutrition Continue ensure  Nutrition consult    Palliative care meeting this afternoon .  DVT prophylaxis: SCDs Start: 04/23/24 2131      Code Status: Limited: Do not attempt resuscitation (DNR) -DNR-LIMITED -Do Not Intubate/DNI  Family Communication:   Status is: Inpatient Remains inpatient appropriate because: Continue hospital stay to further establish patient's goals of care   PT Follow up Recs: Skilled Nursing-Short Term Rehab (<3 Hours/Day)04/25/2024 1136  Subjective: SIL and daughter at bedside.  Patient resting without any complaints.    Examination:  General exam:  Appears calm and comfortable  Respiratory system: Clear to auscultation. Respiratory effort normal. Cardiovascular system: S1 & S2 heard, RRR. No JVD, murmurs, rubs, gallops or clicks. No pedal edema. Gastrointestinal system: Abdomen is nondistended, soft and nontender. No organomegaly or masses felt. Normal bowel sounds heard. Central nervous system: unable to assess.  Extremities: Symmetric 5 x 5 power. Skin: No rashes, lesions or ulcers Psychiatry: unable to assess                Diet Orders (From admission, onward)     Start     Ordered   04/24/24 1258  Diet regular Room service appropriate? No; Fluid consistency: Thin  Diet effective now       Question Answer Comment  Room service appropriate? No   Fluid consistency: Thin      04/24/24 1258            Objective: Vitals:   04/25/24 1303 04/25/24 1400 04/25/24 2024 04/26/24 0428  BP: (!) 144/67 103/66 (!) 144/63 (!) 142/73  Pulse: 95 72 (!) 102 89  Resp: 16 16 17 16   Temp: 97.8 F (36.6 C) 98.8 F (37.1 C) 98.6 F (37 C) 98.1 F (36.7 C)  TempSrc: Oral Oral Oral Oral  SpO2: 97% 97% 96% 94%  Weight:      Height:        Intake/Output Summary (Last 24 hours) at 04/26/2024 1324 Last data filed at 04/25/2024 1900 Gross per 24 hour  Intake 270.63 ml  Output --  Net 270.63 ml   Filed Weights   04/23/24 2320  Weight: 55.1 kg    Scheduled Meds:  docusate sodium  100 mg Oral Daily   feeding supplement  237 mL Oral BID BM   multivitamin with minerals  1 tablet Oral Daily   pantoprazole   40 mg Oral Daily   psyllium  1 packet Oral Daily   sodium phosphate  1 enema Rectal Once   Continuous Infusions:  sodium chloride  50 mL/hr at 04/25/24 1533    Nutritional status Signs/Symptoms: per patient/family report Interventions: Ensure Enlive (each supplement provides 350kcal and 20 grams of protein) Body mass index is 21.52 kg/m.  Data Reviewed:   CBC: Recent Labs  Lab 04/23/24 1637 04/24/24 0100  04/25/24 0758 04/26/24 0621  WBC 15.8* 15.9* 20.5* 18.9*  HGB 11.4* 10.2* 10.4* 10.5*  HCT 35.3* 31.3* 31.9* 32.4*  MCV 88.5 87.7 88.1 89.3  PLT 463* 428* 465* 489*   Basic Metabolic Panel: Recent Labs  Lab 04/23/24 1637 04/23/24 2246 04/24/24 0100 04/25/24 0758 04/26/24 0621  NA 145  --  136 135 136  K 4.6  --  4.1 4.2 4.5  CL 104  --  102 102 102  CO2 29  --  25 25 23   GLUCOSE 144*  --  220* 216* 124*  BUN 28*  --  26* 24* 28*  CREATININE 0.51  --  0.47 0.48 0.48  CALCIUM 9.5  --  8.7* 8.9 8.9  MG  --  1.9  --   --   --    GFR: Estimated Creatinine Clearance: 33.3 mL/min (by C-G formula based on SCr of 0.48 mg/dL). Liver Function Tests: Recent Labs  Lab 04/23/24 1637  AST 18  ALT 11  ALKPHOS 77  BILITOT 0.8  PROT 6.3*  ALBUMIN 3.2*   Recent Labs  Lab 04/23/24 1637  LIPASE 14   No results for input(s): AMMONIA in the last 168 hours. Coagulation Profile: No results for input(s): INR, PROTIME in the last 168 hours. Cardiac Enzymes: No results for input(s): CKTOTAL, CKMB, CKMBINDEX, TROPONINI in the last 168 hours. BNP (last 3 results) No results for input(s): PROBNP in the last 8760 hours. HbA1C: No results for input(s): HGBA1C in the last 72 hours. CBG: No results for input(s): GLUCAP in the last 168 hours. Lipid Profile: No results for input(s): CHOL, HDL, LDLCALC, TRIG, CHOLHDL, LDLDIRECT in the last 72 hours. Thyroid  Function Tests: Recent Labs    04/23/24 2248  TSH 0.469   Anemia Panel: Recent Labs    04/23/24 2247  VITAMINB12 737  FERRITIN 240  TIBC 234*  IRON 14*   Sepsis Labs: No results for input(s): PROCALCITON, LATICACIDVEN in the last 168 hours.  No results found for this or any previous visit (from the past 240 hours).       Radiology Studies: No results found.         LOS: 1 day   Time spent= 35 mins    Burgess JAYSON Dare, MD [facility]  If 7PM-7AM, please contact  night-coverage  04/26/2024, 1:24 PM

## 2024-04-26 NOTE — Evaluation (Signed)
 Occupational Therapy Evaluation Patient Details Name: Beth Cantu MRN: [id number] DOB: Dec 04, 1924 Today's Date: 04/26/2024   History of Present Illness   Beth Cantu is a 89 y.o. female who presented to ED with lower abdominal pain and constipation. CT abdomen/pelvis: Peripherally enhancing lobulated multiloculated right lower abdominal mesenteric mass abutting the terminal ileum and sigmoid, with suspected terminal ileal wall thickening. Findings concerning for malignancy. Three hypodense peripherally enhancing hepatic masses suggestive of metastases.  Large volume hiatal hernia containing entire stomach and long loop of transverse colon-no obstruction/ischemia finding, nephrolithiasis colonic diverticulosis. PMH: HLD, GERD, hx of TIA, hiatal hernia varicose veins, venous stasis     Clinical Impressions Pt presents with decline in function and safety with ADLs and ADL mobility. Pt requires max encouragement for minimal activity, agreeable to sit EOB, declined STS, walking with RW or transfers to chair. PTA pt lives at ILF and reports Ind with bathing, dressing, and toileting; [employer] delivers meals apartment; daughter recently started doing pt's laundry, used rollator, walks laps in kitchen for exercise, reports 1 fall. Pt demos significant decline and doe snot wish to further participate in therapies. Pt's daughter reports that p will go to hospice after acute stay d/c. All education completed and no further acute OT services are indicated at this time.      If plan is discharge home, recommend the following:   A lot of help with bathing/dressing/bathroom;A lot of help with walking and/or transfers;Assist for transportation;Direct supervision/assist for medications management     Functional Status Assessment   Patient has had a recent decline in their functional status and demonstrates the ability to make significant improvements in function in a reasonable and predictable amount of  time.     Equipment Recommendations   Hospital bed     Recommendations for Other Services         Precautions/Restrictions   Precautions Precautions: Fall Restrictions Weight Bearing Restrictions Per Provider Order: No     Mobility Bed Mobility Overal bed mobility: Needs Assistance Bed Mobility: Rolling, Supine to Sit, Sit to Supine Rolling: Min assist, Used rails   Supine to sit: Mod assist Sit to supine: Mod assist        Transfers                   General transfer comment: pt declines      Balance Overall balance assessment: Needs assistance Sitting-balance support: No upper extremity supported, Feet supported Sitting balance-Leahy Scale: Fair                                     ADL either performed or assessed with clinical judgement   ADL                                         General ADL Comments: total A with bathing, dressing and toileting; pt refused BSC, pt with Poor appetite but is able to feed herself     Vision Ability to See in Adequate Light: 0 Adequate Patient Visual Report: No change from baseline       Perception         Praxis         Pertinent Vitals/Pain Pain Assessment Pain Assessment: Faces Faces Pain Scale: Hurts little more Pain Location: back Pain Descriptors / Indicators:  Discomfort, Aching Pain Intervention(s): Limited activity within patient's tolerance, Monitored during session, Repositioned     Extremity/Trunk Assessment Upper Extremity Assessment Upper Extremity Assessment: Generalized weakness;Right hand dominant   Lower Extremity Assessment Lower Extremity Assessment: Defer to PT evaluation       Communication Communication Communication: No apparent difficulties   Cognition Arousal: Alert Behavior During Therapy: WFL for tasks assessed/performed, Anxious                                 Following commands: Intact        Cueing  General Comments   Cueing Techniques: Verbal cues      Exercises     Shoulder Instructions      Home Living Family/patient expects to be discharged to:: Other (Comment)                             Home Equipment: Rollator (4 wheels);Other (comment)          Prior Functioning/Environment Prior Level of Function : Independent/Modified Independent;Needs assist             Mobility Comments: [place]ator in [place] apartment at [place], walks laps in kitchen for exercise, admits to 1 fall, had OPPT this year for back pain but unsuccessful ADLs Comments: pt reports ind with bathing, dressing, and toileting; [employer] delivers meals to [place] apartment; daughter recently started doing pt's laundry    OT Problem List: Decreased strength;Decreased knowledge of use of DME or AE;Decreased activity tolerance;Impaired balance (sitting and/or standing);Pain   OT Treatment/Interventions:        OT Goals(Current goals can be found in the care plan section)   Acute Rehab OT Goals Patient Stated Goal: none stated   OT Frequency:       Co-evaluation              AM-PAC OT 6 Clicks Daily Activity     Outcome Measure Help from another person eating meals?: A Little Help from another person taking care of personal grooming?: Total Help from another person toileting, which includes using toliet, bedpan, or urinal?: Total Help from another person bathing (including washing, rinsing, drying)?: Total Help from another person to put on and taking off regular upper body clothing?: Total Help from another person to put on and taking off regular lower body clothing?: Total 6 Click Score: 8   End of Session Nurse Communication: Mobility status  Activity Tolerance: Patient limited by fatigue Patient left: in bed;with call bell/phone within reach;with family/visitor present;with bed alarm set  OT Visit Diagnosis: Other abnormalities of gait and mobility  (R26.89);Muscle weakness (generalized) (M62.81);Pain Pain - part of body:  (back)                Time: 8960-8894 OT Time Calculation (min): 26 min Charges:  OT General Charges $OT Visit: 1 Visit OT Evaluation $OT Eval Moderate Complexity: 1 Mod OT Treatments $Therapeutic Activity: 8-22 mins    Beth Cantu Loose 04/26/2024, 12:55 PM

## 2024-04-26 NOTE — Progress Notes (Signed)
   This pt was referred for hospice care at the [facility] in [facility]. I was able to speak with the pt's daughter [name] Agent. She is very receptive to comfort care and would like to visit the facility with family that is coming out of town today after meeting with [facility] team at hospital. My doctor has been updated and reviewed the pt and the pt has been approved for at bed at our hospice facility. If family decides this is there wish.   Magdalena Berber RN [phone]

## 2024-04-27 DIAGNOSIS — K6389 Other specified diseases of intestine: Secondary | ICD-10-CM | POA: Diagnosis not present

## 2024-04-27 MED ORDER — BISACODYL 10 MG RE SUPP: 10.0000 mg | Freq: Every day | RECTAL | Status: AC | PRN

## 2024-04-27 MED ORDER — DOCUSATE SODIUM 100 MG PO CAPS: 100.0000 mg | ORAL_CAPSULE | Freq: Every day | ORAL | Status: AC

## 2024-04-27 MED ORDER — TRAMADOL HCL 50 MG PO TABS
50.0000 mg | ORAL_TABLET | Freq: Three times a day (TID) | ORAL | Status: DC
  Filled 2024-04-27: qty 1

## 2024-04-27 MED ORDER — ACETAMINOPHEN 500 MG PO TABS: 1000.0000 mg | ORAL_TABLET | Freq: Three times a day (TID) | ORAL | Status: AC

## 2024-04-27 MED ORDER — ACETAMINOPHEN 500 MG PO TABS
1000.0000 mg | ORAL_TABLET | Freq: Three times a day (TID) | ORAL | Status: DC
  Administered 2024-04-27: 1000 mg via ORAL
  Filled 2024-04-27: qty 2

## 2024-04-27 MED ORDER — HYDROCODONE-ACETAMINOPHEN 5-325 MG PO TABS: 1.0000 | ORAL_TABLET | ORAL | 0 refills | Status: AC | PRN

## 2024-04-27 MED ORDER — TRAMADOL HCL 50 MG PO TABS: 50.0000 mg | ORAL_TABLET | Freq: Three times a day (TID) | ORAL | 0 refills | Status: AC

## 2024-04-27 NOTE — Plan of Care (Signed)
  Problem: Coping: Goal: Level of anxiety will decrease Outcome: Progressing   Problem: Pain Managment: Goal: General experience of comfort will improve and/or be controlled Outcome: Progressing   Problem: Skin Integrity: Goal: Risk for impaired skin integrity will decrease Outcome: Progressing

## 2024-04-27 NOTE — Progress Notes (Signed)
 PROGRESS NOTE    Beth Cantu  FMW:[id number] DOB: [birth year] DOA: 04/23/2024 PCP: Jordan, Betty G, MD    Brief Narrative:   89 year old with history of HLD, GERD, TIA, hiatal hernia, varicose veins, venous stasis comes to the ED with lower abdominal pain and constipation.  Workup revealed large mesenteric mass abutting terminal ileum and sigmoid colon with liver lesions.  Oncology, palliative care and IR were consulted.  After discussions with the family, patient has been transition to hospice care.  At this time no real advance treatments are really an option for her given her advanced age and frailty.  Eventually patient is being transferred to hospice.  Assessment & Plan:   Mesenteric mass with metastatic liver lesion Chronic pain Given her advanced age, frailty she is not really a candidate for any advanced therapies.  No biopsies have been planned.  Family to stay in pursuing hospice therefore we will transition the patient today.  Scheduled pain medications including Tylenol  and tramadol has been ordered.  Additional narcotics as necessary   Weakness Supportive care   Leukocytosis Reactive, will hold off on any further lab work   Normocytic anemia Iron deficiency B12 normal --CBC to monitor   Hiatal hernia Large volume hiatal hernia containing the entire stomach and long loop of transverse colon.  No evidence of obstruction  --Continue PPI    Constipation Bowel regimen per orders   Gastroesophageal reflux disease with esophagitis without hemorrhage Continue PPI    Mild protein malnutrition Continue ensure  Nutrition consult    Palliative care meeting this afternoon .  DVT prophylaxis: SCDs Start: 04/23/24 2131      Code Status: Limited: Do not attempt resuscitation (DNR) -DNR-LIMITED -Do Not Intubate/DNI  Family Communication:   Status is: Inpatient Remains inpatient appropriate because: Discharge   PT Follow up Recs: Skilled Nursing-Short Term Rehab (<3  Hours/Day)04/25/2024 1136  Subjective: Met with the patient and family at bedside.  Hospice arrangements have been made.  No complaints  Examination:  General exam: Appears calm and comfortable  Respiratory system: Clear to auscultation. Respiratory effort normal. Cardiovascular system: S1 & S2 heard, RRR. No JVD, murmurs, rubs, gallops or clicks. No pedal edema. Gastrointestinal system: Abdomen is nondistended, soft and nontender. No organomegaly or masses felt. Normal bowel sounds heard. Central nervous system: unable to assess.  Extremities: Symmetric 5 x 5 power. Skin: No rashes, lesions or ulcers Psychiatry: unable to assess                Diet Orders (From admission, onward)     Start     Ordered   04/24/24 1258  Diet regular Room service appropriate? No; Fluid consistency: Thin  Diet effective now       Question Answer Comment  Room service appropriate? No   Fluid consistency: Thin      04/24/24 1258            Objective: Vitals:   04/26/24 0428 04/26/24 1359 04/26/24 2036 04/27/24 0418  BP: (!) 142/73 122/66 109/68 132/65  Pulse: 89 94 85 91  Resp: 16 17 (!) 22 18  Temp: 98.1 F (36.7 C) 97.8 F (36.6 C) (!) 97.5 F (36.4 C) 98.4 F (36.9 C)  TempSrc: Oral Oral Axillary Oral  SpO2: 94% 93% 95% 94%  Weight:      Height:        Intake/Output Summary (Last 24 hours) at 04/27/2024 1204 Last data filed at 04/26/2024 1300 Gross per 24 hour  Intake 220  ml  Output --  Net 220 ml   Filed Weights   04/23/24 2320  Weight: 55.1 kg    Scheduled Meds:  acetaminophen   1,000 mg Oral TID   docusate sodium   100 mg Oral Daily   feeding supplement  237 mL Oral BID BM   multivitamin with minerals  1 tablet Oral Daily   pantoprazole   40 mg Oral Daily   psyllium  1 packet Oral Daily   sodium phosphate  1 enema Rectal Once   traMADol   50 mg Oral Q8H   Continuous Infusions:  Nutritional status Signs/Symptoms: per patient/family  report Interventions: Ensure Enlive (each supplement provides 350kcal and 20 grams of protein) Body mass index is 21.52 kg/m.  Data Reviewed:   CBC: Recent Labs  Lab 04/23/24 1637 04/24/24 0100 04/25/24 0758 04/26/24 0621  WBC 15.8* 15.9* 20.5* 18.9*  HGB 11.4* 10.2* 10.4* 10.5*  HCT 35.3* 31.3* 31.9* 32.4*  MCV 88.5 87.7 88.1 89.3  PLT 463* 428* 465* 489*   Basic Metabolic Panel: Recent Labs  Lab 04/23/24 1637 04/23/24 2246 04/24/24 0100 04/25/24 0758 04/26/24 0621  NA 145  --  136 135 136  K 4.6  --  4.1 4.2 4.5  CL 104  --  102 102 102  CO2 29  --  25 25 23   GLUCOSE 144*  --  220* 216* 124*  BUN 28*  --  26* 24* 28*  CREATININE 0.51  --  0.47 0.48 0.48  CALCIUM 9.5  --  8.7* 8.9 8.9  MG  --  1.9  --   --   --    GFR: Estimated Creatinine Clearance: 33.3 mL/min (by C-G formula based on SCr of 0.48 mg/dL). Liver Function Tests: Recent Labs  Lab 04/23/24 1637  AST 18  ALT 11  ALKPHOS 77  BILITOT 0.8  PROT 6.3*  ALBUMIN 3.2*   Recent Labs  Lab 04/23/24 1637  LIPASE 14   No results for input(s): AMMONIA in the last 168 hours. Coagulation Profile: No results for input(s): INR, PROTIME in the last 168 hours. Cardiac Enzymes: No results for input(s): CKTOTAL, CKMB, CKMBINDEX, TROPONINI in the last 168 hours. BNP (last 3 results) No results for input(s): PROBNP in the last 8760 hours. HbA1C: No results for input(s): HGBA1C in the last 72 hours. CBG: No results for input(s): GLUCAP in the last 168 hours. Lipid Profile: No results for input(s): CHOL, HDL, LDLCALC, TRIG, CHOLHDL, LDLDIRECT in the last 72 hours. Thyroid  Function Tests: No results for input(s): TSH, T4TOTAL, FREET4, T3FREE, THYROIDAB in the last 72 hours. Anemia Panel: No results for input(s): VITAMINB12, FOLATE, FERRITIN, TIBC, IRON, RETICCTPCT in the last 72 hours. Sepsis Labs: No results for input(s): PROCALCITON, LATICACIDVEN  in the last 168 hours.  No results found for this or any previous visit (from the past 240 hours).       Radiology Studies: No results found.         LOS: 2 days   Time spent= 35 mins    Burgess JAYSON Dare, MD [facility]  If 7PM-7AM, please contact night-coverage  04/27/2024, 12:04 PM

## 2024-04-27 NOTE — TOC Transition Note (Signed)
 Transition of Care [facility]) - Discharge Note   Patient Details  Name: Beth Cantu MRN: [id number] Date of Birth: Mar 23, 1925  [facility] of [facility] CM/SW Contact:  Toy LITTIE Agar, RN Phone Number:[phone]  04/27/2024, 10:40 AM   Clinical Narrative:    CM has received confirmation from [facility] that facility can accept patient and transport can be called. Transport has been arranged via [facility]. Nurse has been provided number to call report. Daughter [name] has been notified.  D/c packet is at nurses station. No other needs noted.    Final next level of care: Hospice Medical Facility Barriers to Discharge: No Barriers Identified   Patient Goals and CMS Choice Patient states their goals for this hospitalization and ongoing recovery are:: TBD          Discharge Placement              Patient chooses bed at:  [facility]) Patient to be transferred to facility by: [facility] Name of family member notified: [name] Agent Patient and family notified of of transfer: 04/27/24  Discharge Plan and Services Additional resources added to the After Visit Summary for   In-house Referral: Clinical Social Work Discharge Planning Services: NA Post Acute Care Choice: Resumption of Svcs/PTA Provider          DME Arranged: N/A DME Agency: NA       HH Arranged: NA HH Agency: NA        Social Drivers of Health (SDOH) Interventions SDOH Screenings   Food Insecurity: No Food Insecurity (04/23/2024)  Housing: Low Risk  (04/23/2024)  Transportation Needs: No Transportation Needs (04/23/2024)  Utilities: Not At Risk (04/23/2024)  Alcohol Screen: Low Risk  (11/04/2023)  Depression (PHQ2-9): Low Risk  (11/04/2023)  Financial Resource Strain: Low Risk  (11/04/2023)  Physical Activity: Inactive (11/04/2023)  Social Connections: Socially Isolated (04/23/2024)  Stress: No Stress Concern Present (11/04/2023)  Tobacco Use: Low Risk  (04/23/2024)  Health Literacy: Adequate  Health Literacy (11/04/2023)     Readmission Risk Interventions     No data to display

## 2024-04-27 NOTE — Discharge Summary (Signed)
 Physician Discharge Summary  Beth Cantu FMW:[id number] DOB: 01/01/1925 DOA: 04/23/2024  PCP: Jordan, Betty G, MD  Admit date: 04/23/2024 Discharge date: 04/27/2024  Admitted From: Home Disposition:  Hospice  Recommendations for Outpatient Follow-up:  Dc to hospice     Brief/Interim Summary: Brief Narrative:   89 year old with history of HLD, GERD, TIA, hiatal hernia, varicose veins, venous stasis comes to the ED with lower abdominal pain and constipation.  Workup revealed large mesenteric mass abutting terminal ileum and sigmoid colon with liver lesions.  Oncology, palliative care and IR were consulted.  After discussions with the family, patient has been transition to hospice care.  At this time no real advance treatments are really an option for her given her advanced age and frailty.  Eventually patient is being transferred to hospice.  Assessment & Plan:   Mesenteric mass with metastatic liver lesion Chronic pain Given her advanced age, frailty she is not really a candidate for any advanced therapies.  No biopsies have been planned.  Family to stay in pursuing hospice therefore we will transition the patient today.  Scheduled pain medications including Tylenol  and tramadol has been ordered.  Additional narcotics as necessary   Weakness Supportive care   Leukocytosis Reactive, will hold off on any further lab work   Normocytic anemia Iron deficiency B12 normal --CBC to monitor   Hiatal hernia Large volume hiatal hernia containing the entire stomach and long loop of transverse colon.  No evidence of obstruction  --Continue PPI    Constipation Bowel regimen per orders   Gastroesophageal reflux disease with esophagitis without hemorrhage Continue PPI    Mild protein malnutrition Continue ensure  Nutrition consult    Palliative care meeting this afternoon .  DVT prophylaxis: SCDs Start: 04/23/24 2131      Code Status: Limited: Do not attempt resuscitation  (DNR) -DNR-LIMITED -Do Not Intubate/DNI  Family Communication:   Status is: Inpatient Remains inpatient appropriate because: Discharge   PT Follow up Recs: Skilled Nursing-Short Term Rehab (<3 Hours/Day)04/25/2024 1136  Subjective: Met with the patient and family at bedside.  Hospice arrangements have been made.  No complaints  Examination:  General exam: Appears calm and comfortable  Respiratory system: Clear to auscultation. Respiratory effort normal. Cardiovascular system: S1 & S2 heard, RRR. No JVD, murmurs, rubs, gallops or clicks. No pedal edema. Gastrointestinal system: Abdomen is nondistended, soft and nontender. No organomegaly or masses felt. Normal bowel sounds heard. Central nervous system: unable to assess.  Extremities: Symmetric 5 x 5 power. Skin: No rashes, lesions or ulcers Psychiatry: unable to assess    Discharge Diagnoses:  Principal Problem:   Mesenteric mass Active Problems:   Leukocytosis   Weakness   Normocytic anemia   Hiatal hernia   Constipation   Gastroesophageal reflux disease with esophagitis without hemorrhage   Mild protein malnutrition   Multiple metastatic lesions in liver [facility])   Intraabdominal mass      Discharge Exam: Vitals:   04/26/24 2036 04/27/24 0418  BP: 109/68 132/65  Pulse: 85 91  Resp: (!) 22 18  Temp: (!) 97.5 F (36.4 C) 98.4 F (36.9 C)  SpO2: 95% 94%   Vitals:   04/26/24 0428 04/26/24 1359 04/26/24 2036 04/27/24 0418  BP: (!) 142/73 122/66 109/68 132/65  Pulse: 89 94 85 91  Resp: 16 17 (!) 22 18  Temp: 98.1 F (36.7 C) 97.8 F (36.6 C) (!) 97.5 F (36.4 C) 98.4 F (36.9 C)  TempSrc: Oral Oral Axillary Oral  SpO2:  94% 93% 95% 94%  Weight:      Height:          Discharge Instructions   Allergies as of 04/27/2024       Reactions   Aspirin    Other reaction(s): Other (See Comments) Other Reaction: GI UPSET   Erythromycin    Other reaction(s): Other (See Comments) Makes her sick         Medication List     TAKE these medications    acetaminophen  500 MG tablet Commonly known as: TYLENOL  Take 2 tablets (1,000 mg total) by mouth 3 (three) times daily.   bisacodyl 10 MG suppository Commonly known as: DULCOLAX Place 1 suppository (10 mg total) rectally daily as needed for moderate constipation.   docusate sodium 100 MG capsule Commonly known as: COLACE Take 1 capsule (100 mg total) by mouth daily.   HYDROcodone-acetaminophen  5-325 MG tablet Commonly known as: NORCO/VICODIN Take 1-2 tablets by mouth every 4 (four) hours as needed for moderate pain (pain score 4-6).   pantoprazole  40 MG tablet Commonly known as: PROTONIX  Take 1 tablet (40 mg total) by mouth daily.   PRESERVISION AREDS 2 PO Take 1 tablet by mouth daily.   WOMENS MULTIVITAMIN PO Take 1 tablet by mouth daily.   psyllium 58.6 % powder Commonly known as: METAMUCIL Take 1 packet by mouth daily.   traMADol 50 MG tablet Commonly known as: ULTRAM Take 1 tablet (50 mg total) by mouth every 8 (eight) hours.        Follow-up Information     Jordan, Betty G, MD Follow up in 1 week(s).   Specialty: Family Medicine Contact information: [address] [place] [address] [phone]                Allergies  Allergen Reactions   Aspirin     Other reaction(s): Other (See Comments) Other Reaction: GI UPSET   Erythromycin     Other reaction(s): Other (See Comments) Makes her sick    You were cared for by a hospitalist during your hospital stay. If you have any questions about your discharge medications or the care you received while you were in the hospital after you are discharged, you can call the unit and asked to speak with the hospitalist on call if the hospitalist that took care of you is not available. Once you are discharged, your primary care physician will handle any further medical issues. Please note that no refills for any discharge medications will be authorized  once you are discharged, as it is imperative that you return to your primary care physician (or establish a relationship with a primary care physician if you do not have one) for your aftercare needs so that they can reassess your need for medications and monitor your lab values.  You were cared for by a hospitalist during your hospital stay. If you have any questions about your discharge medications or the care you received while you were in the hospital after you are discharged, you can call the unit and asked to speak with the hospitalist on call if the hospitalist that took care of you is not available. Once you are discharged, your primary care physician will handle any further medical issues. Please note that NO REFILLS for any discharge medications will be authorized once you are discharged, as it is imperative that you return to your primary care physician (or establish a relationship with a primary care physician if you do not have one) for  your aftercare needs so that they can reassess your need for medications and monitor your lab values.  Please request your Prim.MD to go over all Hospital Tests and Procedure/Radiological results at the follow up, please get all Hospital records sent to your Prim MD by signing hospital release before you go home.  Get CBC, CMP, 2 view Chest X ray checked  by Primary MD during your next visit or SNF MD in 5-7 days ( we routinely change or add medications that can affect your baseline labs and fluid status, therefore we recommend that you get the mentioned basic workup next visit with your PCP, your PCP may decide not to get them or add new tests based on their clinical decision)  On your next visit with your primary care physician please Get Medicines reviewed and adjusted.  If you experience worsening of your admission symptoms, develop shortness of breath, life threatening emergency, suicidal or homicidal thoughts you must seek medical attention immediately by  calling 911 or calling your MD immediately  if symptoms less severe.  You Must read complete instructions/literature along with all the possible adverse reactions/side effects for all the Medicines you take and that have been prescribed to you. Take any new Medicines after you have completely understood and accpet all the possible adverse reactions/side effects.   Do not drive, operate heavy machinery, perform activities at heights, swimming or participation in water activities or provide baby sitting services if your were admitted for syncope or siezures until you have seen by Primary MD or a Neurologist and advised to do so again.  Do not drive when taking Pain medications.   Procedures/Studies: CT ABDOMEN PELVIS W CONTRAST Result Date: 04/23/2024 EXAM: CT ABDOMEN AND PELVIS WITH CONTRAST 04/23/2024 06:27:13 PM TECHNIQUE: CT of the abdomen and pelvis was performed with the administration of 100 mL iohexol  (OMNIPAQUE ) 300 MG/ML solution. Multiplanar reformatted images are provided for review. Automated exposure control, iterative reconstruction, and/or weight-based adjustment of the mA/kV was utilized to reduce the radiation dose to as low as reasonably achievable. COMPARISON: None available. CLINICAL HISTORY: LLQ abdominal pain; RLQ abdominal pain FINDINGS: LOWER CHEST: Large volume hiatal hernia on the left containing the entire stomach as well as a long loop of transverse colon. LIVER: Total of 3 hypodense peripherally enhancing hepatic masses, for example, 2.7 cm lesion (2.27). GALLBLADDER AND BILE DUCTS: Gallbladder is unremarkable. No biliary ductal dilatation. SPLEEN: No acute abnormality. PANCREAS: No acute abnormality. ADRENAL GLANDS: No acute abnormality. KIDNEYS, URETERS AND BLADDER: 4 mm left nephrolithiasis. No right nephrolithiasis. No ureterolithiasis bilaterally. No hydroureteronephrosis. No perinephric or periureteral stranding. No filling defects of the partially visualized collecting  systems on delayed imaging. The urinary bladder lumen is dilated with urine. GI AND BOWEL: Stomach demonstrates no acute abnormality. There is no bowel obstruction. Colonic diverticulosis. Stool within the rectum. No small or large bowel thickening or dilatation. The appendix is not definitely identified with no inflammatory changes in the right lower quadrant to suggest acute appendicitis. Questioned terminal ileum bowel wall thickening with the terminal ileum extending along the right lower quadrant mass. PERITONEUM AND RETROPERITONEUM: There is a peripherally enhancing lobulated multiloculated mass within the right lower abdomen mesentery (2:58, 8.44). This mass abuts the terminal ileum as well as a short segment of the cecum. No ascites. No free air. VASCULATURE: Aorta is normal in caliber. LYMPH NODES: No lymphadenopathy. REPRODUCTIVE ORGANS: Status post hysterectomy. No adnexal mass. BONES AND SOFT TISSUES: Diffusely decreased bone density. No acute fracture. No suspicious  lytic or blastic osseous lesions. No focal soft tissue abnormality. IMPRESSION: 1. Peripherally enhancing lobulated multiloculated right lower abdominal mesenteric mass abutting the terminal ileum and sigmoid, with suspected terminal ileal wall thickening. Findings concerning for malignancy. 2. Three hypodense peripherally enhancing hepatic masses suggestive of metastases. 3. Large volume hiatal hernia containing the entire stomach and a long loop of transverse colon. No associated obstruction or findings to suggest ischemia. 4. Nonobstructive 4 mm left nephrolithiasis. 5. Colonic diverticulosis without evidence of diverticulitis. 6. Other, non-acute and/or normal findings as above. Electronically signed by: Morgane Naveau MD 04/23/2024 06:45 PM EST RP Workstation: [id number]     The results of significant diagnostics from this hospitalization (including imaging, microbiology, ancillary and laboratory) are listed below for reference.      Microbiology: No results found for this or any previous visit (from the past 240 hours).   Labs: BNP (last 3 results) No results for input(s): BNP in the last 8760 hours. Basic Metabolic Panel: Recent Labs  Lab 04/23/24 1637 04/23/24 2246 04/24/24 0100 04/25/24 0758 04/26/24 0621  NA 145  --  136 135 136  K 4.6  --  4.1 4.2 4.5  CL 104  --  102 102 102  CO2 29  --  25 25 23   GLUCOSE 144*  --  220* 216* 124*  BUN 28*  --  26* 24* 28*  CREATININE 0.51  --  0.47 0.48 0.48  CALCIUM 9.5  --  8.7* 8.9 8.9  MG  --  1.9  --   --   --    Liver Function Tests: Recent Labs  Lab 04/23/24 1637  AST 18  ALT 11  ALKPHOS 77  BILITOT 0.8  PROT 6.3*  ALBUMIN 3.2*   Recent Labs  Lab 04/23/24 1637  LIPASE 14   No results for input(s): AMMONIA in the last 168 hours. CBC: Recent Labs  Lab 04/23/24 1637 04/24/24 0100 04/25/24 0758 04/26/24 0621  WBC 15.8* 15.9* 20.5* 18.9*  HGB 11.4* 10.2* 10.4* 10.5*  HCT 35.3* 31.3* 31.9* 32.4*  MCV 88.5 87.7 88.1 89.3  PLT 463* 428* 465* 489*   Cardiac Enzymes: No results for input(s): CKTOTAL, CKMB, CKMBINDEX, TROPONINI in the last 168 hours. BNP: Invalid input(s): POCBNP CBG: No results for input(s): GLUCAP in the last 168 hours. D-Dimer No results for input(s): DDIMER in the last 72 hours. Hgb A1c No results for input(s): HGBA1C in the last 72 hours. Lipid Profile No results for input(s): CHOL, HDL, LDLCALC, TRIG, CHOLHDL, LDLDIRECT in the last 72 hours. Thyroid  function studies No results for input(s): TSH, T4TOTAL, T3FREE, THYROIDAB in the last 72 hours.  Invalid input(s): FREET3 Anemia work up No results for input(s): VITAMINB12, FOLATE, FERRITIN, TIBC, IRON, RETICCTPCT in the last 72 hours. Urinalysis    Component Value Date/Time   COLORURINE CANCELED 12/10/2021 1019   Sepsis Labs Recent Labs  Lab 04/23/24 1637 04/24/24 0100 04/25/24 0758 04/26/24 0621   WBC 15.8* 15.9* 20.5* 18.9*   Microbiology No results found for this or any previous visit (from the past 240 hours).   Time coordinating discharge:  I have spent 35 minutes face to face with the patient and on the ward discussing the patients care, assessment, plan and disposition with other care givers. >50% of the time was devoted counseling the patient about the risks and benefits of treatment/Discharge disposition and coordinating care.   SIGNED:   Burgess JAYSON Dare, MD  [facility] 04/27/2024, 12:38 PM   If 7PM-7AM, please  contact night-coverage

## 2024-04-27 NOTE — Progress Notes (Signed)
 Patient has been repositioned, but still states I am uncomfortable and my back is hurting. This nurse tried to offer pain medication since repositioning the patient did not seem to being any relief. The patient refused all pharmacological interventions.  The patient has been growing increasingly agitated. The patient states my family will just come help me. I don't want no pain medication.

## 2024-05-08 DEATH — deceased

## 2024-06-13 ENCOUNTER — Ambulatory Visit: Payer: Self-pay | Admitting: Podiatry
